# Patient Record
Sex: Female | Born: 1970 | Race: Black or African American | Hispanic: No | Marital: Married | Smoking: Never smoker
Health system: Southern US, Community
[De-identification: ages and names within clinical notes are randomized; demographics above are authoritative.]

## PROBLEM LIST (undated history)

## (undated) DIAGNOSIS — E119 Type 2 diabetes mellitus without complications: Secondary | ICD-10-CM

## (undated) DIAGNOSIS — I10 Essential (primary) hypertension: Secondary | ICD-10-CM

## (undated) DIAGNOSIS — E079 Disorder of thyroid, unspecified: Secondary | ICD-10-CM

## (undated) DIAGNOSIS — K219 Gastro-esophageal reflux disease without esophagitis: Secondary | ICD-10-CM

## (undated) HISTORY — DX: Type 2 diabetes mellitus without complications: E11.9

## (undated) HISTORY — DX: Essential (primary) hypertension: I10

## (undated) HISTORY — DX: Disorder of thyroid, unspecified: E07.9

---

## 1995-09-26 HISTORY — PX: TUBAL LIGATION: SHX77

## 1998-02-24 ENCOUNTER — Other Ambulatory Visit: Admission: RE | Admit: 1998-02-24 | Discharge: 1998-02-24 | Payer: Self-pay | Admitting: Obstetrics and Gynecology

## 1998-05-18 ENCOUNTER — Ambulatory Visit (HOSPITAL_COMMUNITY): Admission: RE | Admit: 1998-05-18 | Discharge: 1998-05-18 | Payer: Self-pay | Admitting: Obstetrics and Gynecology

## 2000-01-24 ENCOUNTER — Other Ambulatory Visit: Admission: RE | Admit: 2000-01-24 | Discharge: 2000-01-24 | Payer: Self-pay | Admitting: Obstetrics & Gynecology

## 2001-01-25 ENCOUNTER — Other Ambulatory Visit: Admission: RE | Admit: 2001-01-25 | Discharge: 2001-01-25 | Payer: Self-pay | Admitting: Obstetrics and Gynecology

## 2004-12-13 ENCOUNTER — Other Ambulatory Visit: Admission: RE | Admit: 2004-12-13 | Discharge: 2004-12-13 | Payer: Self-pay | Admitting: Obstetrics and Gynecology

## 2006-01-10 ENCOUNTER — Other Ambulatory Visit: Admission: RE | Admit: 2006-01-10 | Discharge: 2006-01-10 | Payer: Self-pay | Admitting: Obstetrics & Gynecology

## 2007-01-18 ENCOUNTER — Other Ambulatory Visit: Admission: RE | Admit: 2007-01-18 | Discharge: 2007-01-18 | Payer: Self-pay | Admitting: Obstetrics & Gynecology

## 2007-12-20 ENCOUNTER — Other Ambulatory Visit: Admission: RE | Admit: 2007-12-20 | Discharge: 2007-12-20 | Payer: Self-pay | Admitting: Obstetrics and Gynecology

## 2008-12-28 ENCOUNTER — Other Ambulatory Visit: Admission: RE | Admit: 2008-12-28 | Discharge: 2008-12-28 | Payer: Self-pay | Admitting: Obstetrics and Gynecology

## 2009-12-29 ENCOUNTER — Other Ambulatory Visit: Admission: RE | Admit: 2009-12-29 | Discharge: 2009-12-29 | Payer: Self-pay | Admitting: Obstetrics and Gynecology

## 2011-05-15 ENCOUNTER — Other Ambulatory Visit (HOSPITAL_COMMUNITY)
Admission: RE | Admit: 2011-05-15 | Discharge: 2011-05-15 | Disposition: A | Discharge status: Home / Self Care | Payer: PRIVATE HEALTH INSURANCE | Source: Ambulatory Visit | Attending: Obstetrics and Gynecology | Admitting: Obstetrics and Gynecology

## 2011-05-15 ENCOUNTER — Other Ambulatory Visit: Payer: Self-pay | Admitting: Obstetrics and Gynecology

## 2011-05-15 DIAGNOSIS — Z01419 Encounter for gynecological examination (general) (routine) without abnormal findings: Secondary | ICD-10-CM | POA: Insufficient documentation

## 2012-05-17 ENCOUNTER — Other Ambulatory Visit (HOSPITAL_COMMUNITY)
Admission: RE | Admit: 2012-05-17 | Discharge: 2012-05-17 | Disposition: A | Discharge status: Home / Self Care | Payer: BC Managed Care – PPO | Source: Ambulatory Visit | Attending: Obstetrics and Gynecology | Admitting: Obstetrics and Gynecology

## 2012-05-17 DIAGNOSIS — Z01419 Encounter for gynecological examination (general) (routine) without abnormal findings: Secondary | ICD-10-CM | POA: Insufficient documentation

## 2012-05-17 DIAGNOSIS — Z1151 Encounter for screening for human papillomavirus (HPV): Secondary | ICD-10-CM | POA: Insufficient documentation

## 2012-05-24 ENCOUNTER — Ambulatory Visit
Admission: RE | Admit: 2012-05-24 | Discharge: 2012-05-24 | Disposition: A | Discharge status: Home / Self Care | Payer: BC Managed Care – PPO | Source: Ambulatory Visit | Attending: Family Medicine | Admitting: Family Medicine

## 2012-05-24 ENCOUNTER — Other Ambulatory Visit: Payer: Self-pay | Admitting: Family Medicine

## 2012-05-24 DIAGNOSIS — E041 Nontoxic single thyroid nodule: Secondary | ICD-10-CM

## 2012-05-28 ENCOUNTER — Other Ambulatory Visit: Payer: Self-pay | Admitting: Family Medicine

## 2012-05-28 DIAGNOSIS — E042 Nontoxic multinodular goiter: Secondary | ICD-10-CM

## 2012-05-29 ENCOUNTER — Ambulatory Visit
Admission: RE | Admit: 2012-05-29 | Discharge: 2012-05-29 | Disposition: A | Discharge status: Home / Self Care | Payer: BC Managed Care – PPO | Source: Ambulatory Visit | Attending: Family Medicine | Admitting: Family Medicine

## 2012-05-29 ENCOUNTER — Other Ambulatory Visit (HOSPITAL_COMMUNITY)
Admission: RE | Admit: 2012-05-29 | Discharge: 2012-05-29 | Disposition: A | Discharge status: Home / Self Care | Payer: BC Managed Care – PPO | Source: Ambulatory Visit | Attending: Interventional Radiology | Admitting: Interventional Radiology

## 2012-05-29 DIAGNOSIS — E042 Nontoxic multinodular goiter: Secondary | ICD-10-CM

## 2012-05-29 DIAGNOSIS — E049 Nontoxic goiter, unspecified: Secondary | ICD-10-CM | POA: Insufficient documentation

## 2013-11-29 ENCOUNTER — Encounter: Payer: 59 | Attending: Family Medicine

## 2013-11-29 VITALS — Ht 63.0 in | Wt 248.4 lb

## 2013-11-29 DIAGNOSIS — Z713 Dietary counseling and surveillance: Secondary | ICD-10-CM | POA: Insufficient documentation

## 2013-11-29 DIAGNOSIS — E119 Type 2 diabetes mellitus without complications: Secondary | ICD-10-CM | POA: Insufficient documentation

## 2013-11-29 NOTE — Progress Notes (Signed)
Patient was seen on 11/29/13 for the complete diabetes self-management series at the [facility].  Current A1c = unknown  Handouts given during class include:  Living Well with Diabetes book  Carb Counting and Meal Planning book  Meal Plan Card  Carbohydrate guide  Meal planning worksheet  Low Sodium Flavoring Tips  The diabetes portion plate  Low Carbohydrate Snack Suggestions  A1c to eAG Conversion Chart  Diabetes Medications  Stress Management  Diabetes Recommended Care Schedule  Diabetes Success Plan  Core Class Satisfaction Survey  The following learning objectives were met by the patient during this course:  Describe diabetes  State some common risk factors for diabetes  Defines the role of glucose and insulin  Identifies type of diabetes and pathophysiology  Describe the relationship between diabetes and cardiovascular risk  State the members of the Healthcare Team  States the rationale for glucose monitoring  State when to test glucose  State their individual Target Range  State the importance of logging glucose readings  Describe how to interpret glucose readings  Identifies A1C target  Explain the correlation between A1c and eAG values  State symptoms and treatment of high blood glucose  State symptoms and treatment of low blood glucose  Explain proper technique for glucose testing  Identifies proper sharps disposal  Describe the role of different macronutrients on glucose  Explain how carbohydrates affect blood glucose  State what foods contain the most carbohydrates  Demonstrate carbohydrate counting  Demonstrate how to read Nutrition Facts food label  Describe effects of various fats on heart health  Describe the importance of good nutrition for health and healthy eating strategies  Describe techniques for managing your shopping, cooking and meal planning  List strategies to follow meal plan  when dining out  Describe the effects of alcohol on glucose and how to use it safely   State the amount of activity recommended for healthy living   Describe activities suitable for individual needs   Identify ways to regularly incorporate activity into daily life   Identify barriers to activity and ways to over come these barriers  Identify diabetes medications being personally used and their primary action for lowering glucose and possible side effects   Describe role of stress on blood glucose and develop strategies to address psychosocial issues   Identify diabetes complications and ways to prevent them  Explain how to manage diabetes during illness   Evaluate success in meeting personal goal   Establish 2-3 goals that they will plan to diligently work on until they return for the  23-monthfollow-up visit  Goals:  Follow Diabetes Meal Plan as instructed  Eat 3 meals and 2 snacks, every 3-5 hrs  Limit carbohydrate intake to 45 grams carbohydrate/meal Limit carbohydrate intake to 15 grams carbohydrate/snack Add lean protein foods to meals/snacks  Monitor glucose levels as instructed by your doctor  Aim for 15-30 mins of physical activity daily as tolerated  Bring food record and glucose log to your next nutrition visit  Your patient has established the following 4 month goals in their individualized success plan: I will count my carb choices at most meals and snacks and stop drinking regular sodas and juices I will increase my activity level 30 minutes a day  7 days a week  Your patient has identified these potential barriers to change:  No, I have to make better choices, especially when eating out  Your patient has identified their diabetes self-care support plan as  My family and co-workers   

## 2013-12-07 ENCOUNTER — Emergency Department (HOSPITAL_COMMUNITY)
Admission: EM | Admit: 2013-12-07 | Discharge: 2013-12-07 | Disposition: A | Discharge status: Home / Self Care | Payer: Self-pay

## 2013-12-08 ENCOUNTER — Other Ambulatory Visit: Payer: Self-pay | Admitting: Obstetrics and Gynecology

## 2013-12-08 DIAGNOSIS — Z975 Presence of (intrauterine) contraceptive device: Secondary | ICD-10-CM

## 2013-12-12 ENCOUNTER — Ambulatory Visit (HOSPITAL_COMMUNITY)
Admission: RE | Admit: 2013-12-12 | Discharge: 2013-12-12 | Disposition: A | Discharge status: Home / Self Care | Payer: 59 | Source: Ambulatory Visit | Attending: Obstetrics and Gynecology | Admitting: Obstetrics and Gynecology

## 2013-12-12 DIAGNOSIS — Z975 Presence of (intrauterine) contraceptive device: Secondary | ICD-10-CM

## 2013-12-12 DIAGNOSIS — Z30431 Encounter for routine checking of intrauterine contraceptive device: Secondary | ICD-10-CM | POA: Insufficient documentation

## 2013-12-17 ENCOUNTER — Other Ambulatory Visit: Payer: Self-pay | Admitting: Obstetrics and Gynecology

## 2013-12-17 ENCOUNTER — Ambulatory Visit (HOSPITAL_COMMUNITY): Payer: 59

## 2013-12-17 DIAGNOSIS — N83202 Unspecified ovarian cyst, left side: Secondary | ICD-10-CM

## 2014-01-27 ENCOUNTER — Ambulatory Visit (HOSPITAL_COMMUNITY)
Admission: RE | Admit: 2014-01-27 | Discharge: 2014-01-27 | Disposition: A | Discharge status: Home / Self Care | Payer: 59 | Source: Ambulatory Visit | Attending: Obstetrics and Gynecology | Admitting: Obstetrics and Gynecology

## 2014-01-27 DIAGNOSIS — N83202 Unspecified ovarian cyst, left side: Secondary | ICD-10-CM

## 2014-01-27 DIAGNOSIS — N83209 Unspecified ovarian cyst, unspecified side: Secondary | ICD-10-CM | POA: Insufficient documentation

## 2014-03-20 ENCOUNTER — Other Ambulatory Visit: Payer: Self-pay | Admitting: Endocrinology

## 2014-03-30 ENCOUNTER — Encounter: Payer: 59 | Attending: Family Medicine

## 2014-03-30 DIAGNOSIS — Z713 Dietary counseling and surveillance: Secondary | ICD-10-CM | POA: Insufficient documentation

## 2014-03-30 DIAGNOSIS — E119 Type 2 diabetes mellitus without complications: Secondary | ICD-10-CM | POA: Diagnosis present

## 2014-03-31 NOTE — Progress Notes (Signed)
Patient was seen on 03/30/2014 for a review of the series of three diabetes self-management courses at the [facility]. The following learning objectives were met by the patient during this class:    Reviewed blood glucose monitoring and interpretation including the recommended target ranges and Hgb A1c.    Reviewed on carb counting, importance of regularly scheduled meals/snacks, and meal planning.    Reviewed the effects of physical activity on glucose levels and long-term glucose control.  Recommended goal of 150 minutes of physical activity/week.   Reviewed patient medications and discussed role of medication on blood glucose and possible side effects.   Discussed strategies to manage stress, psychosocial issues, and other obstacles to diabetes management.   Encouraged moderate weight reduction to improve glucose levels.     Reviewed short-term complications: hyper- and hypo-glycemia.  Discussed causes, symptoms, and treatment options.   Reviewed prevention, detection, and treatment of long-term complications.  Discussed the role of prolonged elevated glucose levels on body systems.  Goals:  Follow Diabetes Meal Plan as instructed  Eat 3 meals and 2 snacks, every 3-5 hrs  Limit carbohydrate intake to 45 grams carbohydrate/meal Limit carbohydrate intake to 15 grams carbohydrate/snack Add lean protein foods to meals/snacks  Monitor glucose levels as instructed by your doctor  Aim for goal of 15-30 mins of physical activity daily as tolerated  Bring food record and glucose log to your next nutrition visit  

## 2014-04-16 ENCOUNTER — Encounter: Payer: Self-pay | Admitting: Endocrinology

## 2014-04-16 ENCOUNTER — Ambulatory Visit (INDEPENDENT_AMBULATORY_CARE_PROVIDER_SITE_OTHER): Payer: 59 | Admitting: Endocrinology

## 2014-04-16 VITALS — BP 138/98 | HR 90 | Resp 16 | Wt 248.4 lb

## 2014-04-16 DIAGNOSIS — E041 Nontoxic single thyroid nodule: Secondary | ICD-10-CM

## 2014-04-16 NOTE — Progress Notes (Signed)
Patient ID: Kirsten [name], female   DOB: 08-Jan-1971, 43 y.o.   MRN: [id number]    Reason for Appointment: Thyroid nodule, followup    History of Present Illness:   The patient's thyroid nodule was first discovered in 8/13  She has had an ultrasound exam in 04/2012 which showed the following: Large, macrolobulated, smoothly made marginated cystic and solid mass occupying the majority of the left lobe of the thyroid gland. This is predominately solid and has internal blood flow with color Doppler. This measures approximately 9.5 x 3.9 x 3.1 cm in maximum dimensions.  Also has 2.7 x 2.0 x 1.3 cm oval, predominately solid mass with small cystic components in the mid and inferior portion of the right lobe of the  thyroid gland. This has internal blood flow with color Doppler.  Thyroid biopsy in 8/13 showed benign cytology  Because of the large size of the left thyroid nodule she was tried on levothyroxine for suppression. Initially with 75 mcg her TSH was relatively low and she was subsequently switched to 50 mcg. She has been taking this for about a year and is now here for followup. Does not feel any swelling in the neck herself  She has had no difficulty with swallowing  Does not feel like she has any choking sensation in her neck or pressure in any position or when lying down.    Medication List       This list is accurate as of: 04/16/14 11:59 PM.  Always use your most recent med list.               levothyroxine 50 MCG tablet  Commonly known as:  SYNTHROID, LEVOTHROID  TAKE 1 TABLET BY MOUTH EVERY MORNING ON EMPTY STOMACH     losartan 50 MG tablet  Commonly known as:  COZAAR  Take 50 mg by mouth daily.     metFORMIN 500 MG 24 hr tablet  Commonly known as:  GLUCOPHAGE-XR  Take 500 mg by mouth 4 (four) times daily.     MIRENA 20 MCG/24HR IUD  Generic drug:  levonorgestrel  1 each by Intrauterine route once.     Vitamin D3 5000 UNITS Tabs  Take by mouth.         Allergies: No Known Allergies  Past Medical History  Diagnosis Date  . Diabetes mellitus without complication   . Hypertension   . Thyroid disease     No past surgical history on file.  No family history on file.  Social History:  reports that she has never smoked. She has never used smokeless tobacco. Her alcohol and drug histories are not on file.   Review of Systems:  There is a  history of high blood pressure.             No history of Diabetes.             Examination:   BP 138/98  Pulse 90  Resp 16  Wt 248 lb 6.4 oz (112.674 kg)  SpO2 97%   General Appearance:  well-looking             THYROID: Thyroid nodule on the left side measures about 4 cm across and is smooth, firm, better felt on swallowing . Right lobe is felt about twice normal The neck circumference is 42.5 cm  There is no lymphadenopathy in the neck.    Reflexes  at biceps are normal.   Assessment/Plan:  Multinodular goiter with predominantly left thyroid  nodule which is benign on previous biopsy Previous records are not available and will be needed to compare her exam findings Will check her thyroid levels and assess need for any change in her levothyroxine dose  She will continue followup annually   [facility] 04/23/2014  Addendum: TSH is low, will stop her thyroid supplement and have her come back in 6 months  Lab Results  Component Value Date   TSH 0.16* 04/16/2014

## 2014-04-17 LAB — TSH: TSH: 0.16 u[IU]/mL — AB (ref 0.35–4.50)

## 2014-04-17 LAB — T4, FREE: Free T4: 0.92 ng/dL (ref 0.60–1.60)

## 2014-04-17 NOTE — Progress Notes (Signed)
Quick Note:  Please let patient know that the Thyroid result is high, stop Synthroid ______

## 2014-04-20 ENCOUNTER — Telehealth: Payer: Self-pay | Admitting: Endocrinology

## 2014-04-20 NOTE — Telephone Encounter (Signed)
Patient states she is returning Kirsten [name]'s call   Thank you

## 2014-04-22 ENCOUNTER — Telehealth: Payer: Self-pay | Admitting: *Deleted

## 2014-04-22 NOTE — Telephone Encounter (Signed)
She will not need to be on medication again. The test was being monitored since she was taking the thyroid pill We can see her again in 6 months for office visit with labs

## 2014-04-22 NOTE — Telephone Encounter (Signed)
Patient received my message about stopping her synthroid, she was told to follow up in one year. Patient wants to know how long she needs to stop it for and if you are going to recheck her labs before that one year is up?

## 2014-04-22 NOTE — Telephone Encounter (Signed)
CB# [phone]

## 2014-04-22 NOTE — Telephone Encounter (Signed)
Noted, patient is aware. 

## 2014-04-23 DIAGNOSIS — E041 Nontoxic single thyroid nodule: Secondary | ICD-10-CM | POA: Insufficient documentation

## 2014-05-18 ENCOUNTER — Other Ambulatory Visit: Payer: Self-pay | Admitting: Obstetrics and Gynecology

## 2014-05-18 ENCOUNTER — Other Ambulatory Visit (HOSPITAL_COMMUNITY)
Admission: RE | Admit: 2014-05-18 | Discharge: 2014-05-18 | Disposition: A | Discharge status: Home / Self Care | Payer: 59 | Source: Ambulatory Visit | Attending: Obstetrics and Gynecology | Admitting: Obstetrics and Gynecology

## 2014-05-18 DIAGNOSIS — Z124 Encounter for screening for malignant neoplasm of cervix: Secondary | ICD-10-CM | POA: Diagnosis not present

## 2014-05-18 DIAGNOSIS — Z1151 Encounter for screening for human papillomavirus (HPV): Secondary | ICD-10-CM | POA: Diagnosis present

## 2014-05-19 LAB — CYTOLOGY - PAP

## 2014-10-12 ENCOUNTER — Other Ambulatory Visit (INDEPENDENT_AMBULATORY_CARE_PROVIDER_SITE_OTHER): Payer: BLUE CROSS/BLUE SHIELD

## 2014-10-12 DIAGNOSIS — E041 Nontoxic single thyroid nodule: Secondary | ICD-10-CM

## 2014-10-12 LAB — T4, FREE: FREE T4: 0.75 ng/dL (ref 0.60–1.60)

## 2014-10-12 LAB — TSH: TSH: 1 u[IU]/mL (ref 0.35–4.50)

## 2014-10-16 ENCOUNTER — Ambulatory Visit: Payer: 59 | Admitting: Endocrinology

## 2014-10-20 ENCOUNTER — Encounter: Payer: Self-pay | Admitting: *Deleted

## 2014-10-20 ENCOUNTER — Telehealth: Payer: Self-pay | Admitting: Endocrinology

## 2014-10-20 ENCOUNTER — Ambulatory Visit: Payer: BLUE CROSS/BLUE SHIELD | Admitting: Endocrinology

## 2014-10-20 NOTE — Telephone Encounter (Signed)
Letter mailed

## 2014-10-20 NOTE — Telephone Encounter (Signed)
Patient no showed today's appt. Please advise on how to follow up. °A. No follow up necessary. °B. Follow up urgent. Contact patient immediately. °C. Follow up necessary. Contact patient and schedule visit in ___ days. °D. Follow up advised. Contact patient and schedule visit in ____weeks. ° °

## 2015-01-22 NOTE — Patient Outreach (Signed)
[facility] Care Management  01/22/2015  Carolann LittlerDana M Waibel 07/03/1971 [id number]  Member has not responded to appointment request letter sent on 12/11/14.  Member has not been seen by [facility] since 05/18/14.  Case closed as member has followed attendance policy.  Termination letter sent to member. Dudley MajorMelissa Mirabelle Cyphers RN, [facility] Care Management Coordinator-[facility] [phone]

## 2015-03-22 ENCOUNTER — Other Ambulatory Visit: Payer: Self-pay

## 2015-10-22 ENCOUNTER — Encounter: Payer: Self-pay | Admitting: Internal Medicine

## 2015-10-22 ENCOUNTER — Ambulatory Visit (INDEPENDENT_AMBULATORY_CARE_PROVIDER_SITE_OTHER): Payer: BLUE CROSS/BLUE SHIELD | Admitting: Internal Medicine

## 2015-10-22 VITALS — BP 136/86 | HR 96 | Ht 63.0 in | Wt 256.6 lb

## 2015-10-22 DIAGNOSIS — R05 Cough: Secondary | ICD-10-CM

## 2015-10-22 DIAGNOSIS — R058 Other specified cough: Secondary | ICD-10-CM

## 2015-10-22 MED ORDER — FAMOTIDINE 20 MG PO TABS: ORAL_TABLET | ORAL | Status: DC

## 2015-10-22 MED ORDER — TRAMADOL HCL 50 MG PO TABS: ORAL_TABLET | ORAL | Status: DC

## 2015-10-22 MED ORDER — PREDNISONE 10 MG PO TABS: ORAL_TABLET | ORAL | Status: DC

## 2015-10-22 MED ORDER — ESOMEPRAZOLE MAGNESIUM 40 MG PO CPDR: 40.0000 mg | DELAYED_RELEASE_CAPSULE | Freq: Every day | ORAL | Status: DC

## 2015-10-22 NOTE — Progress Notes (Signed)
Subjective:    Patient ID: Kirsten [name], female    DOB: April 18, 1971,     MRN: [id number]  HPI  55 yobf never smoker with bad cough dec 2015 x 3 m  and again oct 2016 x 6 weeks then early Dec 2016 recurrent /persistent so referred to pulmonary clinic 10/22/2015 by Dr [name].   10/22/2015 1st [facility] office visit/ Kirsten [name]   Chief Complaint  Patient presents with  . Pulmonary Consult    Referred by Dr. [name]. Pt c/o cough x 6 wks- prod at times with yellow sputum. Cough is worse at night and has coughed to the point of vomiting.   pattern is recurrent 1.5 years, abrupt in onset/ this time assoc with rhinorrhea which was clear and running forwards and backwards and so severe vomit.  rx already with augmentin Jan 10-20 and tessilon pearls not helping  - cough is 24/7 now unless takes tussionex which helps some Started nexium x one week and has h/o gerd on prn ppi x years but doesn't feel that bad so only rarely takes  Wt 180 during pregnancies most recent 20 years prior to OV  And now 260 with IUD with merena  No obvious patterns in day to day or daytime variabilty or assoc sob or cp or chest tightness, subjective wheeze overt sinus or hb symptoms. No unusual exp hx or h/o childhood pna/ asthma or knowledge of premature birth.  Sleeping ok without nocturnal  or early am exacerbation  of respiratory  c/o's or need for noct saba. Also denies any obvious fluctuation of symptoms with weather or environmental changes or other aggravating or alleviating factors except as outlined above   Current Medications, Allergies, Complete Past Medical History, Past Surgical History, Family History, and Social History were reviewed in [employer] record.            Review of Systems  Constitutional: Negative for fever, chills and unexpected weight change.  HENT: Positive for congestion and sneezing. Negative for dental problem, ear pain, nosebleeds, postnasal drip,  rhinorrhea, sinus pressure, sore throat, trouble swallowing and voice change.   Eyes: Negative for visual disturbance.  Respiratory: Positive for cough. Negative for choking and shortness of breath.   Cardiovascular: Negative for chest pain and leg swelling.  Gastrointestinal: Positive for abdominal pain. Negative for vomiting and diarrhea.  Genitourinary: Negative for difficulty urinating.  Musculoskeletal: Negative for arthralgias.  Skin: Negative for rash.  Neurological: Negative for tremors, syncope and headaches.  Hematological: Does not bruise/bleed easily.       Objective:   Physical Exam  amb obese bf nad  Wt Readings from Last 3 Encounters:  10/22/15 256 lb 9.6 oz (116.393 kg)  04/16/14 248 lb 6.4 oz (112.674 kg)  11/29/13 248 lb 6.4 oz (112.674 kg)    Vital signs reviewed   HEENT: nl turbinates, and oropharynx. Nl external ear canals without cough reflex/  Poor dentition    NECK :  without JVD/Nodes/TM/ nl carotid upstrokes bilaterally   LUNGS: no acc muscle use,  Nl contour chest which is clear to A and P bilaterally without cough on insp or exp maneuvers   CV:  RRR  no s3 or murmur or increase in P2, no edema   ABD:  soft and nontender with nl inspiratory excursion in the supine position. No bruits or organomegaly, bowel sounds nl  MS:  Nl gait/ ext warm without deformities, calf tenderness, cyanosis or clubbing No obvious joint  restrictions   SKIN: warm and dry without lesions    NEURO:  alert, approp, nl sensorium with  no motor deficits        Assessment & Plan:

## 2015-10-22 NOTE — Patient Instructions (Signed)
The key to effective treatment for your cough is eliminating the non-stop cycle of cough you're stuck in long enough to let your airway heal completely and then see if there is anything still making you cough once you stop the cough suppression, but this should take no more than 5 days to figure out  First take delsym two tsp every 12 hours and supplement if needed with  tramadol 50 mg up to 2 every 4 hours to suppress the urge to cough at all or even clear your throat. Swallowing water or using ice chips/non mint and menthol containing candies (such as lifesavers or sugarless jolly ranchers) are also effective.  You should rest your voice and avoid activities that you know make you cough.  Once you have eliminated the cough for 3 straight days try reducing the tramadol first,  then the delsym as tolerated.    Prednisone 10 mg take  4 each am x 2 days,   2 each am x 2 days,  1 each am x 2 days and stop (this is to eliminate allergies and inflammation from coughing)  Nexium 40 mg Take 30-60 min before first meal of the day and Pepcid 20 mg one bedtime plus chlorpheniramine 4 mg x 2 at bedtime (both available over the counter)  until cough is completely gone for at least a week without the need for cough suppression  For drainage / throat tickle try take CHLORPHENIRAMINE  4 mg - take one every 4 hours as needed - available over the counter- may cause drowsiness so start with just a bedtime dose or two and see how you tolerate it before trying in daytime    GERD (REFLUX)  is an extremely common cause of respiratory symptoms, many times with no significant heartburn at all.    It can be treated with medication, but also with lifestyle changes including avoidance of late meals, excessive alcohol, smoking cessation, and avoid fatty foods, chocolate, peppermint, colas, red wine, and acidic juices such as orange juice.  NO MINT OR MENTHOL PRODUCTS SO NO COUGH DROPS  USE HARD CANDY INSTEAD (jolley ranchers or  Stover's or Lifesavers (all available in sugarless versions) NO OIL BASED VITAMINS - use powdered substitutes.  If not better return for cxr early next week.

## 2015-10-23 DIAGNOSIS — R05 Cough: Secondary | ICD-10-CM | POA: Insufficient documentation

## 2015-10-23 DIAGNOSIS — R058 Other specified cough: Secondary | ICD-10-CM | POA: Insufficient documentation

## 2015-10-23 NOTE — Assessment & Plan Note (Addendum)
Body mass index is 45.47 .  Lab Results  Component Value Date   TSH 1.00 10/12/2014     Contributing to gerd tendency/ doe/reviewed the need and the process to achieve and maintain neg calorie balance > defer f/u primary care including intermittently monitoring thyroid status

## 2015-10-23 NOTE — Assessment & Plan Note (Addendum)
Most likely this is uacs related to gerd:  Of the three most common causes of chronic cough, only one (GERD)  can actually cause the other two (asthma and post nasal drip syndrome)  and perpetuate the cylce of cough inducing airway trauma, inflammation, heightened sensitivity to reflux which is prompted by the cough itself via a cyclical mechanism.    This may partially respond to steroids and look like asthma and post nasal drainage but never erradicated completely unless the cough and the secondary reflux are eliminated, preferably both at the same time.  While not intuitively obvious, many patients with chronic low grade reflux do not cough until there is a secondary insult that disturbs the protective epithelial barrier and exposes sensitive nerve endings.  This can be viral or direct physical injury such as with an endotracheal tube.   The point is that once this occurs, it is difficult to eliminate using anything but a maximally effective acid suppression regimen at least in the short run, accompanied by an appropriate diet to address non acid GERD.   DPI's typically make this cough worse and HRT tends to relax the LES making pts who are obese more prone to this cycle.  Will try max rx for gerd/ elimination of cyclical cough and f/u with cxr first of the week if not better and stop advair at this point.   Each maintenance medication was reviewed in detail including most importantly the difference between maintenance and as needed and under what circumstances the prns are to be used.  Please see instructions for details which were reviewed in writing and the patient given a copy.      Total time devoted to counseling  = 35/89m review case with pt/ discussion of options/alternatives/ giving and personally writing then going over instructions (see avs)

## 2016-01-14 ENCOUNTER — Other Ambulatory Visit: Payer: Self-pay | Admitting: Obstetrics and Gynecology

## 2016-01-14 ENCOUNTER — Other Ambulatory Visit (HOSPITAL_COMMUNITY)
Admission: RE | Admit: 2016-01-14 | Discharge: 2016-01-14 | Disposition: A | Discharge status: Home / Self Care | Payer: BLUE CROSS/BLUE SHIELD | Source: Ambulatory Visit | Attending: Obstetrics and Gynecology | Admitting: Obstetrics and Gynecology

## 2016-01-14 DIAGNOSIS — Z01419 Encounter for gynecological examination (general) (routine) without abnormal findings: Secondary | ICD-10-CM | POA: Insufficient documentation

## 2016-01-14 DIAGNOSIS — N760 Acute vaginitis: Secondary | ICD-10-CM | POA: Diagnosis present

## 2016-01-17 LAB — CYTOLOGY - PAP

## 2016-03-02 ENCOUNTER — Telehealth: Payer: Self-pay | Admitting: Internal Medicine

## 2016-03-02 NOTE — Telephone Encounter (Signed)
lmtcb X1 for pt.  Pt was given GERD diet at her last OV.

## 2016-03-03 NOTE — Telephone Encounter (Signed)
Pt returning call from yesterday °

## 2016-03-03 NOTE — Telephone Encounter (Signed)
Spoke with pt. She is aware of MW's GERD diet. A copy of her AVS has been placed in the outgoing mail. Nothing further was needed.

## 2017-02-16 ENCOUNTER — Telehealth: Payer: Self-pay | Admitting: Endocrinology

## 2017-02-16 NOTE — Telephone Encounter (Signed)
Patient called to inquire on the specific dates she had "noshows" and cancellations since she was advised that she no longer is able to come to Endocrinology. I advised patient that there was a cancellation 10-16-14 and a no show 10-20-14. I advised that her last appointment was 04-16-14. Patient disconnected.

## 2017-03-22 ENCOUNTER — Other Ambulatory Visit: Payer: Self-pay | Admitting: Endocrinology

## 2017-03-22 DIAGNOSIS — E041 Nontoxic single thyroid nodule: Secondary | ICD-10-CM

## 2017-04-02 ENCOUNTER — Other Ambulatory Visit: Payer: Self-pay | Admitting: Endocrinology

## 2017-04-02 ENCOUNTER — Ambulatory Visit
Admission: RE | Admit: 2017-04-02 | Discharge: 2017-04-02 | Disposition: A | Discharge status: Home / Self Care | Payer: Self-pay | Source: Ambulatory Visit | Attending: Endocrinology | Admitting: Endocrinology

## 2017-04-02 DIAGNOSIS — E041 Nontoxic single thyroid nodule: Secondary | ICD-10-CM

## 2017-04-05 ENCOUNTER — Other Ambulatory Visit (HOSPITAL_COMMUNITY)
Admission: RE | Admit: 2017-04-05 | Discharge: 2017-04-05 | Disposition: A | Discharge status: Home / Self Care | Payer: No Typology Code available for payment source | Source: Ambulatory Visit | Attending: Radiology | Admitting: Radiology

## 2017-04-05 ENCOUNTER — Ambulatory Visit
Admission: RE | Admit: 2017-04-05 | Discharge: 2017-04-05 | Disposition: A | Discharge status: Home / Self Care | Payer: No Typology Code available for payment source | Source: Ambulatory Visit | Attending: Endocrinology | Admitting: Endocrinology

## 2017-04-05 DIAGNOSIS — E041 Nontoxic single thyroid nodule: Secondary | ICD-10-CM

## 2017-05-08 ENCOUNTER — Encounter (HOSPITAL_COMMUNITY): Payer: Self-pay

## 2017-05-08 ENCOUNTER — Other Ambulatory Visit: Payer: Self-pay

## 2017-05-08 ENCOUNTER — Encounter (HOSPITAL_COMMUNITY)
Admission: RE | Admit: 2017-05-08 | Discharge: 2017-05-08 | Disposition: A | Discharge status: Home / Self Care | Payer: No Typology Code available for payment source | Source: Ambulatory Visit | Attending: Obstetrics and Gynecology | Admitting: Obstetrics and Gynecology

## 2017-05-08 DIAGNOSIS — I444 Left anterior fascicular block: Secondary | ICD-10-CM | POA: Insufficient documentation

## 2017-05-08 DIAGNOSIS — Z01810 Encounter for preprocedural cardiovascular examination: Secondary | ICD-10-CM | POA: Diagnosis present

## 2017-05-08 DIAGNOSIS — Z01812 Encounter for preprocedural laboratory examination: Secondary | ICD-10-CM | POA: Insufficient documentation

## 2017-05-08 HISTORY — DX: Gastro-esophageal reflux disease without esophagitis: K21.9

## 2017-05-08 LAB — BASIC METABOLIC PANEL
Anion gap: 9 (ref 5–15)
BUN: 8 mg/dL (ref 6–20)
CHLORIDE: 102 mmol/L (ref 101–111)
CO2: 26 mmol/L (ref 22–32)
Calcium: 9 mg/dL (ref 8.9–10.3)
Creatinine, Ser: 0.8 mg/dL (ref 0.44–1.00)
GFR calc Af Amer: >60 mL/min (ref >60)
GFR calc non Af Amer: >60 mL/min (ref >60)
Glucose, Bld: 294 mg/dL — ABNORMAL HIGH (ref 65–99)
POTASSIUM: 3.9 mmol/L (ref 3.5–5.1)
SODIUM: 137 mmol/L (ref 135–145)

## 2017-05-08 LAB — CBC
HEMATOCRIT: 36.2 % (ref 36.0–46.0)
HEMOGLOBIN: 12.3 g/dL (ref 12.0–15.0)
MCH: 26 pg (ref 26.0–34.0)
MCHC: 34 g/dL (ref 30.0–36.0)
MCV: 76.5 fL — ABNORMAL LOW (ref 78.0–100.0)
Platelets: 262 10*3/uL (ref 150–400)
RBC: 4.73 MIL/uL (ref 3.87–5.11)
RDW: 14.2 % (ref 11.5–15.5)
WBC: 8.4 10*3/uL (ref 4.0–10.5)

## 2017-05-08 NOTE — Patient Instructions (Signed)
Your procedure is scheduled on:  Wednesday, Aug. 22, 2018  [facility]: 11:15 AM  Pick up the phone at the desk and dial [phone].  Call this number if you have problems the morning of surgery: [phone].  Remember: Do NOT eat food:  After Midnight Tuesday  Do NOT drink clear liquids after:  6:30 AM day of surgery  Take these medicines the morning of surgery with a SIP OF WATER:  Atorvastatin, Levothyroxine, Losartan  Do NOT take Metformin the evening before surgery  Stop ALL herbal medications at this time  Do NOT smoke the day of surgery.  Do NOT wear jewelry (body piercing), metal hair clips/bobby pins, make-up, artifical eyelashes or nail polish. Do NOT wear lotions, powders, or perfumes.  You may wear deodorant. Do NOT shave for 48 hours prior to surgery. Do NOT bring valuables to the hospital. Contacts, dentures, or bridgework may not be worn into surgery.  Have a responsible adult drive you home and stay with you for 24 hours after your procedure  Bring a copy of your healthcare power of attorney and living will documents.

## 2017-05-08 NOTE — Pre-Procedure Instructions (Signed)
Dr. Tacy Duraddono made aware of elevated glucose, no new orders received at this time.  I spoke with Ms. Kirsten [name] to inform her of the elevated glucose she was not surprised she stated her last Hgb A 1C was between 9-10.  She is currently working on bringing her blood glucose levels down. I did make her aware that if her glucose was too high the day of surgery the case may get cancelled until her diabetes is under better control and she verbalized understanding.

## 2017-05-15 NOTE — H&P (Signed)
History of Present Illness  General:  Pt reports heavy bleeding for the first 5 days. Does not have cramps. Changing an overnight super pad every 2-3 hours. Clots are quarter sized or bigger. Pt is getting up 2-3 times overnight. Has a gushing. Has not seen any improvement with Lysteda. Does not want hormonal therapy. Pt wants an endometrial ablation. Last ultrasound 01/2016 showed a normal uterus with no massess, ovariaes normal. IUD correctly positioned at that time. 12/2016 pt requested IUD removal due to irregular bleeding. Pt had previously been treated with OCPs while IUD was in place to regulate bleeding.   Current Medications  Taking   Lysteda(Tranexamic Acid) 650 MG Tablet 2 tablets Orally TID up to 5 days with menses   Losartan Potassium 50 MG Tablet 1 tablet Orally Once a day   Atorvastatin Calcium 10 MG Tablet 1 tablet Orally Once a day   Synjardy(Empagliflozin-Metformin HCl) 12.01-999 MG Tablet 1 tablet with meals Orally Twice a day   Levothyroxine Sodium 25 MCG Tablet 1 tablet on an empty stomach in the morning Orally Once a day   Not-Taking   FreeStyle Lite Test(Blood Glucose Test) Strip check 2 times per day In Vitro   FreeStyle Lancets Miscellaneous check 2 times per day   Proctosol HC(Hydrocortisone) 2.5 % Cream 1 application to affected area Rectal Twice a day fro 5 days, Notes: prn   Valtrex(ValACYclovir HCl) 500 MG Tablet 1 tablet Orally every 12 hrs for 3 days prn per Dr. Dion Body   Diflucan(Fluconazole) 150 MG Tablet 1 tablet Orally once as needed for yeast   Medication List reviewed and reconciled with the patient    Past Medical History  Diabetes Mellitus, type II.   Obesity.   multinodular goiter, s/p FNA 9/13, dr Lucianne Muss.   Early cataracts.           Surgical History  BTL   thyroid bx 03/2017   Family History  Father: alive, DM, HTN, hypercholesterolemia, Prostate cancer, bladder cancer, diagnosed with Diabetes, Hypertension  Mother: alive, HTN,  hypercholesterolemia, seizure disorder, diagnosed with Hypertension  Paternal Grand Father: deceased  Paternal Grand Mother: deceased, diagnosed with Hypertension, Diabetes  Maternal Grand Father: deceased  Maternal Grand Mother: deceased  Brother 1: alive  Sister 1: alive, breast CA, 3, DM, HTN  Paternal uncle: lung cancer  Paternal aunt: 2 with DM, diagnosed with Diabetes  Maternal uncle: stomach CA, diagnosed with Lung Cancer  1 brother(s) , 1 sister(s) .   Cousins have had thyroid enlargement, some needing surgery.   Social History  General:  Tobacco use  cigarettes: Never smoked Tobacco history last updated 04/17/2017 no EXPOSURE TO PASSIVE SMOKE.  no Alcohol, Rare.  no Recreational drug use.  Exercise: minimal.  Marital Status: Divorced, married 10/12 Bobby.  Children: [place], [place], [place], 1 step child, 2 grands.  OCCUPATION: CMA at [employer].  Religion: [employer] [facility].  no Tobacco Exposure.    Gyn History  Sexual activity currently sexually active.  Periods : every 28 days.  LMP 03/27/17.  Birth control BTL.  Last pap smear date 12/2015, all negative.  Last mammogram date 06/19/11, Dx mammo, 6 month f/u, neg, 12/02/13, 12/07/15, 3D mammo, 12/07/16.  Denies H/O Abnormal pap smear.  STD HSV.    OB History  Number of pregnancies 5.  miscarriages 1.  abortion 1.  Pregnancy # 1 live birth, vaginal delivery.  Pregnancy # 2 live birth, vaginal delivery.  Pregnancy # 3 live birth, vaginal delivery.    Allergies  N.K.D.A.   Hospitalization/Major Diagnostic Procedure  childbirth 91, 92, 96   Vital Signs  Wt 245, Wt change -5.4 lb, Ht 63, BMI 43.40, Pulse sitting 84, BP sitting 118/77.   Physical Examination  GENERAL:  Patient appears alert and oriented.  General Appearance: well-appearing, well-developed, no acute distress.  Speech: clear.     Assessments   1. Menorrhagia with regular cycle - N92.0 (Primary)   Treatment  1. Menorrhagia with regular  cycle  Referral [id number] - Gynecology Reason:Precert for Hyst/D&C, HTA ablation    Follow Up  prn (Reason: preop)

## 2017-05-16 ENCOUNTER — Encounter (HOSPITAL_COMMUNITY)
Admission: RE | Disposition: A | Discharge status: Home / Self Care | Payer: Self-pay | Source: Ambulatory Visit | Attending: Obstetrics and Gynecology

## 2017-05-16 ENCOUNTER — Ambulatory Visit (HOSPITAL_COMMUNITY): Payer: No Typology Code available for payment source | Admitting: Anesthesiology

## 2017-05-16 ENCOUNTER — Encounter (HOSPITAL_COMMUNITY): Payer: Self-pay

## 2017-05-16 ENCOUNTER — Ambulatory Visit (HOSPITAL_COMMUNITY)
Admission: RE | Admit: 2017-05-16 | Discharge: 2017-05-16 | Disposition: A | Discharge status: Home / Self Care | Payer: No Typology Code available for payment source | Source: Ambulatory Visit | Attending: Obstetrics and Gynecology | Admitting: Obstetrics and Gynecology

## 2017-05-16 DIAGNOSIS — E669 Obesity, unspecified: Secondary | ICD-10-CM | POA: Diagnosis not present

## 2017-05-16 DIAGNOSIS — N840 Polyp of corpus uteri: Secondary | ICD-10-CM | POA: Diagnosis not present

## 2017-05-16 DIAGNOSIS — Z79899 Other long term (current) drug therapy: Secondary | ICD-10-CM | POA: Insufficient documentation

## 2017-05-16 DIAGNOSIS — N920 Excessive and frequent menstruation with regular cycle: Secondary | ICD-10-CM | POA: Insufficient documentation

## 2017-05-16 DIAGNOSIS — E119 Type 2 diabetes mellitus without complications: Secondary | ICD-10-CM | POA: Diagnosis not present

## 2017-05-16 HISTORY — PX: DILITATION & CURRETTAGE/HYSTROSCOPY WITH HYDROTHERMAL ABLATION: SHX5570

## 2017-05-16 LAB — GLUCOSE, CAPILLARY
GLUCOSE-CAPILLARY: 205 mg/dL — AB (ref 65–99)
Glucose-Capillary: 165 mg/dL — ABNORMAL HIGH (ref 65–99)

## 2017-05-16 LAB — TYPE AND SCREEN
ABO/RH(D): O POS
Antibody Screen: NEGATIVE

## 2017-05-16 LAB — PREGNANCY, URINE: PREG TEST UR: NEGATIVE

## 2017-05-16 LAB — ABO/RH: ABO/RH(D): O POS

## 2017-05-16 SURGERY — DILATATION & CURETTAGE/HYSTEROSCOPY WITH HYDROTHERMAL ABLATION
Anesthesia: General | Site: Vagina

## 2017-05-16 MED ORDER — PROMETHAZINE HCL 25 MG/ML IJ SOLN
INTRAMUSCULAR | Status: AC
  Filled 2017-05-16: qty 1

## 2017-05-16 MED ORDER — FENTANYL CITRATE (PF) 100 MCG/2ML IJ SOLN
INTRAMUSCULAR | Status: DC | PRN
  Administered 2017-05-16 (×2): 50 ug via INTRAVENOUS

## 2017-05-16 MED ORDER — DEXAMETHASONE SODIUM PHOSPHATE 4 MG/ML IJ SOLN
INTRAMUSCULAR | Status: AC
  Filled 2017-05-16: qty 1

## 2017-05-16 MED ORDER — FENTANYL CITRATE (PF) 100 MCG/2ML IJ SOLN
25.0000 ug | INTRAMUSCULAR | Status: DC | PRN
  Administered 2017-05-16 (×3): 50 ug via INTRAVENOUS

## 2017-05-16 MED ORDER — PHENYLEPHRINE HCL 10 MG/ML IJ SOLN
INTRAMUSCULAR | Status: DC | PRN
  Administered 2017-05-16 (×3): 80 ug via INTRAVENOUS

## 2017-05-16 MED ORDER — SCOPOLAMINE 1 MG/3DAYS TD PT72
1.0000 | MEDICATED_PATCH | Freq: Once | TRANSDERMAL | Status: DC
  Administered 2017-05-16: 1.5 mg via TRANSDERMAL

## 2017-05-16 MED ORDER — MEPERIDINE HCL 25 MG/ML IJ SOLN: 6.2500 mg | INTRAMUSCULAR | Status: DC | PRN

## 2017-05-16 MED ORDER — KETOROLAC TROMETHAMINE 30 MG/ML IJ SOLN
INTRAMUSCULAR | Status: DC | PRN
  Administered 2017-05-16: 30 mg via INTRAVENOUS

## 2017-05-16 MED ORDER — LIDOCAINE HCL 2 % IJ SOLN
INTRAMUSCULAR | Status: DC | PRN
  Administered 2017-05-16: 10 mL

## 2017-05-16 MED ORDER — METOCLOPRAMIDE HCL 5 MG/ML IJ SOLN
10.0000 mg | Freq: Once | INTRAMUSCULAR | Status: AC | PRN
  Administered 2017-05-16: 10 mg via INTRAVENOUS

## 2017-05-16 MED ORDER — PROMETHAZINE HCL 25 MG/ML IJ SOLN
6.2500 mg | Freq: Once | INTRAMUSCULAR | Status: AC
  Administered 2017-05-16: 6.25 mg via INTRAVENOUS

## 2017-05-16 MED ORDER — ONDANSETRON HCL 4 MG/2ML IJ SOLN
INTRAMUSCULAR | Status: AC
  Filled 2017-05-16: qty 2

## 2017-05-16 MED ORDER — SCOPOLAMINE 1 MG/3DAYS TD PT72
MEDICATED_PATCH | TRANSDERMAL | Status: AC
  Administered 2017-05-16: 1.5 mg via TRANSDERMAL
  Filled 2017-05-16: qty 1

## 2017-05-16 MED ORDER — METOCLOPRAMIDE HCL 5 MG/ML IJ SOLN
INTRAMUSCULAR | Status: AC
  Filled 2017-05-16: qty 2

## 2017-05-16 MED ORDER — ONDANSETRON HCL 4 MG/2ML IJ SOLN
INTRAMUSCULAR | Status: DC | PRN
  Administered 2017-05-16: 4 mg via INTRAVENOUS

## 2017-05-16 MED ORDER — MIDAZOLAM HCL 2 MG/2ML IJ SOLN
INTRAMUSCULAR | Status: DC | PRN
  Administered 2017-05-16: 2 mg via INTRAVENOUS

## 2017-05-16 MED ORDER — IBUPROFEN 800 MG PO TABS: 800.0000 mg | ORAL_TABLET | Freq: Four times a day (QID) | ORAL | 1 refills | Status: DC | PRN

## 2017-05-16 MED ORDER — SODIUM CHLORIDE 0.9 % IR SOLN
Status: DC | PRN
  Administered 2017-05-16: 3000 mL

## 2017-05-16 MED ORDER — HYDROCODONE-ACETAMINOPHEN 7.5-325 MG PO TABS
1.0000 | ORAL_TABLET | Freq: Once | ORAL | Status: AC | PRN
  Administered 2017-05-16: 1 via ORAL

## 2017-05-16 MED ORDER — PROPOFOL 10 MG/ML IV BOLUS
INTRAVENOUS | Status: AC
  Filled 2017-05-16: qty 20

## 2017-05-16 MED ORDER — FENTANYL CITRATE (PF) 100 MCG/2ML IJ SOLN
INTRAMUSCULAR | Status: AC
  Filled 2017-05-16: qty 2

## 2017-05-16 MED ORDER — PROPOFOL 10 MG/ML IV BOLUS
INTRAVENOUS | Status: DC | PRN
  Administered 2017-05-16: 200 mg via INTRAVENOUS

## 2017-05-16 MED ORDER — LIDOCAINE HCL 2 % IJ SOLN
INTRAMUSCULAR | Status: AC
  Filled 2017-05-16: qty 20

## 2017-05-16 MED ORDER — LIDOCAINE HCL (CARDIAC) 20 MG/ML IV SOLN
INTRAVENOUS | Status: DC | PRN
  Administered 2017-05-16: 50 mg via INTRAVENOUS

## 2017-05-16 MED ORDER — LACTATED RINGERS IV SOLN
INTRAVENOUS | Status: DC
  Administered 2017-05-16 (×2): via INTRAVENOUS

## 2017-05-16 MED ORDER — MIDAZOLAM HCL 2 MG/2ML IJ SOLN
INTRAMUSCULAR | Status: AC
  Filled 2017-05-16: qty 2

## 2017-05-16 MED ORDER — FENTANYL CITRATE (PF) 100 MCG/2ML IJ SOLN
INTRAMUSCULAR | Status: AC
  Administered 2017-05-16: 50 ug via INTRAVENOUS
  Filled 2017-05-16: qty 2

## 2017-05-16 MED ORDER — HYDROCODONE-ACETAMINOPHEN 7.5-325 MG PO TABS
ORAL_TABLET | ORAL | Status: AC
  Filled 2017-05-16: qty 1

## 2017-05-16 SURGICAL SUPPLY — 13 items
CANISTER SUCT 3000ML PPV (MISCELLANEOUS) ×3 IMPLANT
CATH ROBINSON RED A/P 16FR (CATHETERS) ×3 IMPLANT
CLOTH BEACON ORANGE TIMEOUT ST (SAFETY) ×3 IMPLANT
CONTAINER PREFILL 10% NBF 60ML (FORM) ×6 IMPLANT
DILATOR CANAL MILEX (MISCELLANEOUS) IMPLANT
GLOVE BIO SURGEON STRL SZ7 (GLOVE) ×3 IMPLANT
GLOVE BIOGEL PI IND STRL 7.0 (GLOVE) ×2 IMPLANT
GLOVE BIOGEL PI INDICATOR 7.0 (GLOVE) ×4
GOWN STRL REUS W/TWL LRG LVL3 (GOWN DISPOSABLE) ×6 IMPLANT
PACK VAGINAL MINOR WOMEN LF (CUSTOM PROCEDURE TRAY) ×3 IMPLANT
PAD OB MATERNITY 4.3X12.25 (PERSONAL CARE ITEMS) ×3 IMPLANT
SET GENESYS HTA PROCERVA (MISCELLANEOUS) ×3 IMPLANT
TOWEL OR 17X24 6PK STRL BLUE (TOWEL DISPOSABLE) ×6 IMPLANT

## 2017-05-16 NOTE — Anesthesia Procedure Notes (Signed)
Procedure Name: LMA Insertion Date/Time: 05/16/2017 12:51 PM Performed by: Cleda Clarks Pre-anesthesia Checklist: Patient identified, Emergency Drugs available, Suction available and Patient being monitored Patient Re-evaluated:Patient Re-evaluated prior to induction Oxygen Delivery Method: Circle system utilized Preoxygenation: Pre-oxygenation with 100% oxygen Induction Type: IV induction Ventilation: Mask ventilation without difficulty LMA: LMA inserted LMA Size: 4.0 Number of attempts: 1 Placement Confirmation: positive ETCO2 Tube secured with: Tape Dental Injury: Teeth and Oropharynx as per pre-operative assessment

## 2017-05-16 NOTE — Interval H&P Note (Signed)
History and Physical Interval Note:  05/16/2017 12:32 PM  Kirsten [name]  has presented today for surgery, with the diagnosis of N92.0 Menorrhagia  The various methods of treatment have been discussed with the patient and family. After consideration of risks, benefits and other options for treatment, the patient has consented to  Procedure(s): DILATATION & CURETTAGE/HYSTEROSCOPY WITH HYDROTHERMAL ABLATION (N/A) as a surgical intervention .  The patient's history has been reviewed, patient examined, no change in status, stable for surgery.  I have reviewed the patient's chart and labs.  Questions were answered to the patient's satisfaction.     Dion Body, Kirsten [name]

## 2017-05-16 NOTE — Transfer of Care (Signed)
Immediate Anesthesia Transfer of Care Note  Patient: Kirsten [name]  Procedure(s) Performed: Procedure(s): DILATATION & CURETTAGE/HYSTEROSCOPY WITH HYDROTHERMAL ABLATION (N/A)  Patient Location: PACU  Anesthesia Type:General  Level of Consciousness: awake, alert  and oriented  Airway & Oxygen Therapy: Patient Spontanous Breathing and Patient connected to nasal cannula oxygen  Post-op Assessment: Report given to RN and Post -op Vital signs reviewed and stable  Post vital signs: Reviewed and stable  Last Vitals:  Vitals:   05/16/17 1124  BP: (!) 122/91  Pulse: 92  Resp: 18  Temp: 36.7 C  SpO2: 99%    Last Pain:  Vitals:   05/16/17 1124  TempSrc: Oral      Patients Stated Pain Goal: 4 (05/16/17 1124)  Complications: No apparent anesthesia complications

## 2017-05-16 NOTE — Addendum Note (Signed)
Addendum  created 05/16/17 1458 by Armanda Heritage, CRNA   Anesthesia Staff edited

## 2017-05-16 NOTE — Anesthesia Preprocedure Evaluation (Signed)
Anesthesia Evaluation  Patient identified by MRN, date of birth, ID band Patient awake    Reviewed: Allergy & Precautions, NPO status , Patient's Chart, lab work & pertinent test results  Airway Mallampati: III  TM Distance: >3 FB Neck ROM: Full    Dental no notable dental hx. (+) Teeth Intact   Pulmonary  Chronic cough   Pulmonary exam normal breath sounds clear to auscultation       Cardiovascular hypertension, Pt. on medications Normal cardiovascular exam Rhythm:Regular Rate:Normal     Neuro/Psych negative neurological ROS  negative psych ROS   GI/Hepatic GERD  Medicated and Controlled,  Endo/Other  diabetes, Poorly Controlled, Type 2, Oral Hypoglycemic AgentsMorbid obesityHyperlipidemia  Renal/GU   negative genitourinary   Musculoskeletal negative musculoskeletal ROS (+)   Abdominal (+) + obese,   Peds  Hematology   Anesthesia Other Findings   Reproductive/Obstetrics Menorrhagia                             Anesthesia Physical Anesthesia Plan  ASA: III  Anesthesia Plan: General   Post-op Pain Management:    Induction: Intravenous  PONV Risk Score and Plan: 4 or greater and Ondansetron, Dexamethasone, Midazolam, Scopolamine patch - Pre-op, Propofol infusion and Metaclopromide  Airway Management Planned: LMA  Additional Equipment:   Intra-op Plan:   Post-operative Plan: Extubation in OR  Informed Consent: I have reviewed the patients History and Physical, chart, labs and discussed the procedure including the risks, benefits and alternatives for the proposed anesthesia with the patient or authorized representative who has indicated his/her understanding and acceptance.   Dental advisory given  Plan Discussed with: CRNA, Anesthesiologist and Surgeon  Anesthesia Plan Comments:         Anesthesia Quick Evaluation

## 2017-05-16 NOTE — Anesthesia Postprocedure Evaluation (Signed)
Anesthesia Post Note  Patient: Kirsten [name]  Procedure(s) Performed: Procedure(s) (LRB): DILATATION & CURETTAGE/HYSTEROSCOPY WITH HYDROTHERMAL ABLATION (N/A)     Patient location during evaluation: PACU Anesthesia Type: General Level of consciousness: awake and alert Pain management: pain level controlled Vital Signs Assessment: post-procedure vital signs reviewed and stable Respiratory status: spontaneous breathing, nonlabored ventilation and respiratory function stable Cardiovascular status: blood pressure returned to baseline and stable Postop Assessment: no signs of nausea or vomiting Anesthetic complications: no    Last Vitals:  Vitals:   05/16/17 1124  BP: (!) 122/91  Pulse: 92  Resp: 18  Temp: 36.7 C  SpO2: 99%    Last Pain:  Vitals:   05/16/17 1124  TempSrc: Oral   Pain Goal: Patients Stated Pain Goal: 4 (05/16/17 1124)               Ahniya Mitchum A.

## 2017-05-16 NOTE — Discharge Instructions (Addendum)
DISCHARGE INSTRUCTIONS: D&C/ HYSTEROSCOPY / ENDOMETRIAL ABLATION The following instructions have been prepared to help you care for yourself upon your return home.  May take stool softner while taking narcotic pain medication to prevent constipation.  Drink plenty of water.  Personal hygiene:  Use sanitary pads for vaginal drainage, not tampons.  Shower the day after your procedure.  NO tub baths, pools or Jacuzzis for 2-3 weeks.  Wipe front to back after using the bathroom.  Activity and limitations:  Do NOT drive or operate any equipment for 24 hours. The effects of anesthesia are still present and drowsiness may result.  Do NOT rest in bed all day.  Walking is encouraged.  Walk up and down stairs slowly.  You may resume your normal activity in one to two days or as indicated by your physician. Sexual activity: NO intercourse for at least 2 weeks after the procedure, or as indicated by your Doctor.  Diet: Eat a light meal as desired this evening. You may resume your usual diet tomorrow.  Return to Work: You may resume your work activities in one to two days or as indicated by Therapist, sports.  What to expect after your surgery: Expect to have vaginal bleeding/discharge for 2-3 days and spotting for up to 10 days. It is not unusual to have soreness for up to 1-2 weeks. You may have a slight burning sensation when you urinate for the first day. Mild cramps may continue for a couple of days. You may have a regular period in 2-6 weeks.  Call your doctor for any of the following:  Excessive vaginal bleeding or clotting, saturating and changing one pad every hour.  Inability to urinate 6 hours after discharge from hospital.  Pain not relieved by pain medication.  Fever of 100.4 F or greater.  Unusual vaginal discharge or odor.  Patients signature: ______________________ Nurses signature ________________________ Support person's signature_______________________  Post  Anesthesia Home Care Instructions  NO IBUPROFEN PRODUCTS UNTIL: 7:30 PM TONIGHT  Activity: Get plenty of rest for the remainder of the day. A responsible individual must stay with you for 24 hours following the procedure.  For the next 24 hours, DO NOT: -Drive a car -Advertising copywriter -Drink alcoholic beverages -Take any medication unless instructed by your physician -Make any legal decisions or sign important papers.  Meals: Start with liquid foods such as gelatin or soup. Progress to regular foods as tolerated. Avoid greasy, spicy, heavy foods. If nausea and/or vomiting occur, drink only clear liquids until the nausea and/or vomiting subsides. Call your physician if vomiting continues.  Special Instructions/Symptoms: Your throat may feel dry or sore from the anesthesia or the breathing tube placed in your throat during surgery. If this causes discomfort, gargle with warm salt water. The discomfort should disappear within 24 hours.  If you had a scopolamine patch placed behind your ear for the management of post- operative nausea and/or vomiting:  1. The medication in the patch is effective for 72 hours, after which it should be removed.  Wrap patch in a tissue and discard in the trash. Wash hands thoroughly with soap and water. 2. You may remove the patch earlier than 72 hours if you experience unpleasant side effects which may include dry mouth, dizziness or visual disturbances. 3. Avoid touching the patch. Wash your hands with soap and water after contact with the patch.

## 2017-05-16 NOTE — Brief Op Note (Signed)
05/16/2017  1:42 PM  PATIENT:  Kirsten [name]  46 y.o. female  PRE-OPERATIVE DIAGNOSIS:  N92.0 Menorrhagia  POST-OPERATIVE DIAGNOSIS:  N92.0 Menorrhagia  PROCEDURE:  Procedure(s): DILATATION & CURETTAGE/HYSTEROSCOPY WITH HYDROTHERMAL ABLATION (N/A)  SURGEON:  Surgeon(s) and Role:    Geryl Rankins, MD - Primary  PHYSICIAN ASSISTANT: None  ASSISTANTS: none   ANESTHESIA:   general  EBL:  Total I/O In: 1000 [I.V.:1000] Out: 170 [Urine:150; Blood:20]  BLOOD ADMINISTERED:none  DRAINS: none   LOCAL MEDICATIONS USED:  LIDOCAINE 10 ml  SPECIMEN:  Source of Specimen:  endometrial currettings  DISPOSITION OF SPECIMEN:  PATHOLOGY  COUNTS:  YES  TOURNIQUET:  * No tourniquets in log *  DICTATION: .Other Dictation: Dictation Number [id number]  PLAN OF CARE: Discharge to home after PACU  PATIENT DISPOSITION:  PACU - hemodynamically stable.   Delay start of Pharmacological VTE agent (>24hrs) due to surgical blood loss or risk of bleeding: yes

## 2017-05-17 ENCOUNTER — Encounter (HOSPITAL_COMMUNITY): Payer: Self-pay | Admitting: Obstetrics and Gynecology

## 2017-05-17 NOTE — Op Note (Signed)
NAME:  Kirsten, [name] NO.:  [id number]  MEDICAL RECORD NO.:  [id number]  LOCATION:                                 FACILITY:  PHYSICIAN:  Pieter Partridge, MD        DATE OF BIRTH:  DATE OF PROCEDURE:  05/16/2017 DATE OF DISCHARGE:                              OPERATIVE REPORT   PREOPERATIVE DIAGNOSIS:  Menorrhagia.  POSTOPERATIVE DIAGNOSIS:  Menorrhagia.  PROCEDURE:  Hysteroscopy, D and C with HCA endometrial ablation.  SURGEON:  Pieter Partridge, MD.  ASSISTANT:  None.  ANESTHESIA:  General.  ESTIMATED BLOOD LOSS:  20 mL.  DRAINS:  None.  LOCAL:  Lidocaine 2%, 10 mL.  SPECIMEN:  Endometrial curettings.  DISPOSITION OF SPECIMEN:  To pathology.  PATIENT DISPOSITION:  To PACU, hemodynamically stable.  COMPLICATIONS:  None.  FINDINGS:  Normal smooth appearing endometrial cavity.  DESCRIPTION OF PROCEDURE:  The patient was identified in the holding area.  She was then taken to the operating room with IV running.  She was placed in the dorsal lithotomy position and underwent LMA anesthesia.  SCDs were on and operating.  She was then prepped and draped in a normal sterile fashion, a time-out was performed.  Graves speculum was inserted into the vagina.  Cervix was identified. Paracervical block was performed with lidocaine.  Anterior lip of the cervix was grasped with a single-tooth tenaculum.  The cervix was easily dilated up to a 6.  The hysteroscope with the HTA device was advanced easily through the cervix, endometrial cavity with the findings above were noted.  The ablation device was then activated and the ablation proceeded without alarm.  There was a very adequate and appropriate response to the ablation and it appeared to be a very successful ablation.   Survey of cavity was performed.  Hysteroscope was then removed.  Curettage of the uterus was performed.  Specimen was sent to path.  Single-tooth tenaculum was removed.  Tenaculum  site was hemostatic.  All instrument and sponge counts were correct x3.  The patient was taken to the recovery room in stable condition.     Pieter Partridge, MD   ______________________________ Pieter Partridge, MD    EBV/MEDQ  D:  05/16/2017  T:  05/16/2017  Job:  [id number]

## 2018-05-14 ENCOUNTER — Other Ambulatory Visit: Payer: Self-pay | Admitting: Endocrinology

## 2018-05-14 DIAGNOSIS — E041 Nontoxic single thyroid nodule: Secondary | ICD-10-CM

## 2018-05-20 ENCOUNTER — Ambulatory Visit
Admission: RE | Admit: 2018-05-20 | Discharge: 2018-05-20 | Disposition: A | Discharge status: Home / Self Care | Payer: No Typology Code available for payment source | Source: Ambulatory Visit | Attending: Endocrinology | Admitting: Endocrinology

## 2018-05-20 DIAGNOSIS — E041 Nontoxic single thyroid nodule: Secondary | ICD-10-CM

## 2019-03-31 ENCOUNTER — Other Ambulatory Visit: Payer: Self-pay | Admitting: Obstetrics and Gynecology

## 2019-03-31 ENCOUNTER — Other Ambulatory Visit (HOSPITAL_COMMUNITY)
Admission: RE | Admit: 2019-03-31 | Discharge: 2019-03-31 | Disposition: A | Discharge status: Home / Self Care | Payer: PRIVATE HEALTH INSURANCE | Source: Ambulatory Visit | Attending: Obstetrics and Gynecology | Admitting: Obstetrics and Gynecology

## 2019-03-31 DIAGNOSIS — Z124 Encounter for screening for malignant neoplasm of cervix: Secondary | ICD-10-CM | POA: Insufficient documentation

## 2019-04-02 LAB — CYTOLOGY - PAP
Diagnosis: NEGATIVE
HPV: NOT DETECTED

## 2019-07-23 ENCOUNTER — Other Ambulatory Visit: Payer: Self-pay | Admitting: Family Medicine

## 2019-07-23 ENCOUNTER — Other Ambulatory Visit: Payer: Self-pay

## 2019-07-23 ENCOUNTER — Ambulatory Visit
Admission: RE | Admit: 2019-07-23 | Discharge: 2019-07-23 | Disposition: A | Discharge status: Home / Self Care | Payer: PRIVATE HEALTH INSURANCE | Source: Ambulatory Visit | Attending: Family Medicine | Admitting: Family Medicine

## 2019-07-23 DIAGNOSIS — M79671 Pain in right foot: Secondary | ICD-10-CM

## 2020-04-06 ENCOUNTER — Other Ambulatory Visit: Payer: Self-pay | Admitting: Endocrinology

## 2020-04-06 DIAGNOSIS — E041 Nontoxic single thyroid nodule: Secondary | ICD-10-CM

## 2020-04-13 ENCOUNTER — Ambulatory Visit
Admission: RE | Admit: 2020-04-13 | Discharge: 2020-04-13 | Disposition: A | Discharge status: Home / Self Care | Payer: PRIVATE HEALTH INSURANCE | Source: Ambulatory Visit | Attending: Endocrinology | Admitting: Endocrinology

## 2020-04-13 ENCOUNTER — Other Ambulatory Visit: Payer: Self-pay

## 2020-04-13 DIAGNOSIS — E041 Nontoxic single thyroid nodule: Secondary | ICD-10-CM

## 2020-04-21 ENCOUNTER — Other Ambulatory Visit: Payer: Self-pay | Admitting: Radiology

## 2021-04-19 ENCOUNTER — Telehealth: Payer: Self-pay | Admitting: Hematology and Oncology

## 2021-04-19 NOTE — Telephone Encounter (Signed)
Kirsten [name] cld to be scheduled in the high risk breast clinic per letter she received from [facility]. I cld and got her scheduled to see Dr. Al Pimple on 8/15 at 10:40am.

## 2021-05-09 ENCOUNTER — Inpatient Hospital Stay
Payer: No Typology Code available for payment source | Attending: Hematology and Oncology | Admitting: Hematology and Oncology

## 2021-05-09 ENCOUNTER — Other Ambulatory Visit: Payer: Self-pay

## 2021-05-09 ENCOUNTER — Encounter: Payer: Self-pay | Admitting: Hematology and Oncology

## 2021-05-09 VITALS — BP 125/85 | HR 93 | Temp 97.4°F | Resp 18 | Wt 240.0 lb

## 2021-05-09 DIAGNOSIS — Z803 Family history of malignant neoplasm of breast: Secondary | ICD-10-CM | POA: Insufficient documentation

## 2021-05-09 DIAGNOSIS — Z8042 Family history of malignant neoplasm of prostate: Secondary | ICD-10-CM | POA: Insufficient documentation

## 2021-05-09 DIAGNOSIS — Z9189 Other specified personal risk factors, not elsewhere classified: Secondary | ICD-10-CM

## 2021-05-09 NOTE — Progress Notes (Signed)
[facility] CONSULT NOTE  Patient Care Team: [name], MD as PCP - General (Family Medicine)  CHIEF COMPLAINTS/PURPOSE OF CONSULTATION:  Increased risk of breast cancer  ASSESSMENT & PLAN:  No problem-specific Assessment & Plan notes found for this encounter.  No orders of the defined types were placed in this encounter.  We discussed her life time risk of breast cancer and 5 yr risk of breast cancer today which is 29% per TC model and 1.9% 5-year risk per Gail's model  2. We discussed different risk reducing strategies for future breast cancer risk. We discussed chemoprevention and lifestyle modification. We discussed role of ongoing surveillance with mammograms versus MRIs. Patient would be a good candidate for chemoprevention with  tamoxifen.  With tamoxifen side effects would include but not limited to cataracts, increased risk of DVT/PE/cardiovascular events, uterine malignancy, hot flashes, and mood swings.  She would like to think about tamoxifen since her sister had a blood clot while she was taking tamoxifen.  3. Lifestyle modification: We discussed different interventions exercise at least 5 days a week including both aerobic as well as weight-bearing exercises and strength training. He discussed dietary modification including increasing number of servings of fruit and vegetables as well as decreased in number of servings of meat. We discussed the importance of maintaining a good BMI and limiting alcohol intake.  4. Surveillance: Patient certainly would be a good candidate for ongoing mammograms and MRIs on a yearly basis.  She recently had a mammogram, ordered her next MRI in February 2023.  5. Chemoprevention: Discussed about tamoxifen prevention given her 5-year risk of breast cancer per Gail's model is about 1.9%.  6. Genetics: Genetic testing, she does not qualify for genetic testing at this time but have recommended that she let us know if she would like to  be out-of-pocket for the genetic testing.  7. Followup: Patient will be seen back in 6 months time in followup.   With regards to chemoprevention, she would like to think about this.  We will schedule a phone visit in about 4 weeks to discuss her decision.  If she does not want to proceed with chemoprevention, she will proceed MRI in February and return to clinic for follow-up in 3 of 2023.  MRIs unfortunately can lead to more biopsies and false positive testing and there is unknown long-term risk of gadolinium.  HISTORY OF PRESENTING ILLNESS:   Kirsten [name] 50 y.o. female is here because of Increased life time risk of breast cancer per TC model.  This is a very pleasant 50 year old female patient with past medical history significant for diabetes, GERD, hypertension referred to high risk breast clinic given her lifetime risk of breast cancer greater than 20% per [employer] model.  She is healthy otherwise, denies any major complaints except for some breast pain and she had a recent mammogram and ultrasound which did not show any evidence of malignancy.  She did have a breast biopsy last year which showed benign breast parenchyma with duct ectasia, adenosis and pseudo angiomatous stromal hyperplasia.  Sister had breast cancer at the age of 20, had a blood clot with tamoxifen hence now on aromatase inhibitors.  Dad had early stage prostate cancer which was treated with radiation.  No other family history of breast, ovarian, pancreatic cancer or melanoma's.  Rest of the pertinent 10 point ROS reviewed and negative.  REVIEW OF SYSTEMS:   Constitutional: Denies fevers, chills or abnormal night sweats Eyes: Denies blurriness  of vision, double vision or watery eyes Ears, nose, mouth, throat, and face: Denies mucositis or sore throat Respiratory: Denies cough, dyspnea or wheezes Cardiovascular: Denies palpitation, chest discomfort or lower extremity swelling Gastrointestinal:  Denies nausea, heartburn or  change in bowel habits Skin: Denies abnormal skin rashes Lymphatics: Denies new lymphadenopathy or easy bruising Neurological:Denies numbness, tingling or new weaknesses Behavioral/Psych: Mood is stable, no new changes  All other systems were reviewed with the patient and are negative.  MEDICAL HISTORY:  Past Medical History:  Diagnosis Date   Diabetes mellitus without complication (HCC)    GERD (gastroesophageal reflux disease)    Hypertension    Thyroid disease     SURGICAL HISTORY: Past Surgical History:  Procedure Laterality Date   DILITATION & CURRETTAGE/HYSTROSCOPY WITH HYDROTHERMAL ABLATION N/A 05/16/2017   Procedure: DILATATION & CURETTAGE/HYSTEROSCOPY WITH HYDROTHERMAL ABLATION;  Surgeon: Geryl Rankins, MD;  Location: [facility];  Service: Gynecology;  Laterality: N/A;   TUBAL LIGATION  1997    SOCIAL HISTORY: Social History   Socioeconomic History   Marital status: Married    Spouse name: Not on file   Number of children: Not on file   Years of education: Not on file   Highest education level: Not on file  Occupational History   Occupation: CMA  Tobacco Use   Smoking status: Never   Smokeless tobacco: Never  Substance and Sexual Activity   Alcohol use: Yes    Alcohol/week: 0.0 standard drinks    Comment: rare   Drug use: No   Sexual activity: Not on file  Other Topics Concern   Not on file  Social History Narrative   Not on file   Social Determinants of Health   Financial Resource Strain: Not on file  Food Insecurity: Not on file  Transportation Needs: Not on file  Physical Activity: Not on file  Stress: Not on file  Social Connections: Not on file  Intimate Partner Violence: Not on file    FAMILY HISTORY: No family history on file.  ALLERGIES:  is allergic to contrast media [iodinated diagnostic agents].  MEDICATIONS:  Current Outpatient Medications  Medication Sig Dispense Refill   acetaminophen (TYLENOL) 500 MG tablet Take 1,000 mg by  mouth daily as needed for moderate pain.     amLODipine (NORVASC) 5 MG tablet Take 5 mg by mouth daily.     atorvastatin (LIPITOR) 10 MG tablet Take 10 mg by mouth daily.     Cholecalciferol (VITAMIN D) 2000 units CAPS Take 2,000 Units by mouth daily.     FARXIGA 5 MG TABS tablet Take 5 mg by mouth daily.     levothyroxine (SYNTHROID, LEVOTHROID) 25 MCG tablet Take 25 mcg by mouth daily before breakfast.     RYBELSUS 14 MG TABS Take 1 tablet by mouth every morning.     tranexamic acid (LYSTEDA) 650 MG TABS tablet Take 1,300 mg by mouth 3 (three) times daily.     valACYclovir (VALTREX) 500 MG tablet Take 500 mg by mouth daily as needed (outbreak).     No current facility-administered medications for this visit.     PHYSICAL EXAMINATION: ECOG PERFORMANCE STATUS: 0 - Asymptomatic  Vitals:   05/09/21 1100  BP: 125/85  Pulse: 93  Resp: 18  Temp: (!) 97.4 F (36.3 C)  SpO2: 94%   Filed Weights   05/09/21 1100  Weight: 240 lb (108.9 kg)    GENERAL:alert, no distress and comfortable Breast exam: Bilateral breasts inspected.  Right breast marker could  be palpated.  No other densities noted in the right breast.  Nipple looks healthy.  No regional adenopathy. Lower extremities.  No bilateral lower extremity edema.  LABORATORY DATA:  I have reviewed the data as listed Lab Results  Component Value Date   WBC 8.4 05/08/2017   HGB 12.3 05/08/2017   HCT 36.2 05/08/2017   MCV 76.5 (L) 05/08/2017   PLT 262 05/08/2017     Chemistry      Component Value Date/Time   NA 137 05/08/2017 0908   K 3.9 05/08/2017 0908   CL 102 05/08/2017 0908   CO2 26 05/08/2017 0908   BUN 8 05/08/2017 0908   CREATININE 0.80 05/08/2017 0908      Component Value Date/Time   CALCIUM 9.0 05/08/2017 0908     We have calculated her lifetime risk of breast cancer per TC model which comes to around 29%.  5-year risk of breast cancer per Gail's model comes to around 1.9%.  RADIOGRAPHIC STUDIES: I have  personally reviewed the radiological images as listed and agreed with the findings in the report. No results found.  All questions were answered. The patient knows to call the clinic with any problems, questions or concerns. I spent 45 minutes in the care of this patient including H and P, review of records, counseling and coordination of care.     Rachel Moulds, MD 05/09/2021 11:11 AM

## 2021-06-06 ENCOUNTER — Telehealth: Payer: Self-pay | Admitting: Hematology and Oncology

## 2021-06-06 NOTE — Telephone Encounter (Signed)
Called patient regarding upcoming September and February appointments, left a voicemail.

## 2021-06-07 ENCOUNTER — Inpatient Hospital Stay: Payer: No Typology Code available for payment source | Admitting: Hematology and Oncology

## 2021-06-08 ENCOUNTER — Inpatient Hospital Stay
Payer: No Typology Code available for payment source | Attending: Hematology and Oncology | Admitting: Hematology and Oncology

## 2021-06-08 ENCOUNTER — Encounter: Payer: Self-pay | Admitting: Hematology and Oncology

## 2021-06-08 DIAGNOSIS — Z9189 Other specified personal risk factors, not elsewhere classified: Secondary | ICD-10-CM | POA: Diagnosis not present

## 2021-06-08 NOTE — Progress Notes (Signed)
[facility] CONSULT NOTE  Patient Care Team: Kirsten Montana, MD as PCP - General (Family Medicine)  CHIEF COMPLAINTS/PURPOSE OF CONSULTATION:  Increased risk of breast cancer  ASSESSMENT & PLAN:  This is a very pleasant 50 year old female patient who was referred to high risk breast clinic given her lifetime risk of breast cancer greater than 20% and we had a discussion about various measures to reduce her risk of breast cancer.  We are doing a telephone call today to follow-up about her decision on taking tamoxifen.  She has been doing quite well since her last visit with [place].  She tells me that she is had enough time to read about it and review her decision with her family and at this time she is not ready to try to the tamoxifen. She is however willing to follow-up with high risk breast clinic with periodic MRIs and mammograms as well as breast exams.  She is currently going to have her MRI done in February 2023 and she will follow-up with me after February 2023's MRI.  In the meantime I have encouraged her to do self breast exam and report any new breast changes to [place] immediately.  She also mentioned that her sister had genetic testing and her genetic testing was negative for any pathogenic mutations. Return to clinic in February as scheduled.  HISTORY OF PRESENTING ILLNESS:   Kirsten [name] 50 y.o. female is here because of Increased life time risk of breast cancer per TC model   Interval history [name] is here for telephone follow-up.  She has been doing quite well since her last visit.  No new health complaints.  No hospitalizations or change in medications or new allergies.  MEDICAL HISTORY:  Past Medical History:  Diagnosis Date   Diabetes mellitus without complication (HCC)    GERD (gastroesophageal reflux disease)    Hypertension    Thyroid disease     SURGICAL HISTORY: Past Surgical History:  Procedure Laterality Date   DILITATION & CURRETTAGE/HYSTROSCOPY  WITH HYDROTHERMAL ABLATION N/A 05/16/2017   Procedure: DILATATION & CURETTAGE/HYSTEROSCOPY WITH HYDROTHERMAL ABLATION;  Surgeon: Geryl Rankins, MD;  Location: [facility];  Service: Gynecology;  Laterality: N/A;   TUBAL LIGATION  1997    SOCIAL HISTORY: Social History   Socioeconomic History   Marital status: Married    Spouse name: Not on file   Number of children: Not on file   Years of education: Not on file   Highest education level: Not on file  Occupational History   Occupation: CMA  Tobacco Use   Smoking status: Never   Smokeless tobacco: Never  Substance and Sexual Activity   Alcohol use: Yes    Alcohol/week: 0.0 standard drinks    Comment: rare   Drug use: No   Sexual activity: Not on file  Other Topics Concern   Not on file  Social History Narrative   Not on file   Social Determinants of Health   Financial Resource Strain: Not on file  Food Insecurity: Not on file  Transportation Needs: Not on file  Physical Activity: Not on file  Stress: Not on file  Social Connections: Not on file  Intimate Partner Violence: Not on file    FAMILY HISTORY: No family history on file.  ALLERGIES:  is allergic to contrast media [iodinated diagnostic agents].  MEDICATIONS:  Current Outpatient Medications  Medication Sig Dispense Refill   acetaminophen (TYLENOL) 500 MG tablet Take 1,000 mg by mouth daily as needed  for moderate pain.     amLODipine (NORVASC) 5 MG tablet Take 5 mg by mouth daily.     atorvastatin (LIPITOR) 10 MG tablet Take 10 mg by mouth daily.     Cholecalciferol (VITAMIN D) 2000 units CAPS Take 2,000 Units by mouth daily.     FARXIGA 5 MG TABS tablet Take 5 mg by mouth daily.     levothyroxine (SYNTHROID, LEVOTHROID) 25 MCG tablet Take 25 mcg by mouth daily before breakfast.     RYBELSUS 14 MG TABS Take 1 tablet by mouth every morning.     tranexamic acid (LYSTEDA) 650 MG TABS tablet Take 1,300 mg by mouth 3 (three) times daily.     valACYclovir (VALTREX)  500 MG tablet Take 500 mg by mouth daily as needed (outbreak).     No current facility-administered medications for this visit.     PHYSICAL EXAMINATION: ECOG PERFORMANCE STATUS: 0 - Asymptomatic  There were no vitals filed for this visit.  There were no vitals filed for this visit.   Physical exam deferred, telephone visit. LABORATORY DATA:  I have reviewed the data as listed Lab Results  Component Value Date   WBC 8.4 05/08/2017   HGB 12.3 05/08/2017   HCT 36.2 05/08/2017   MCV 76.5 (L) 05/08/2017   PLT 262 05/08/2017     Chemistry      Component Value Date/Time   NA 137 05/08/2017 0908   K 3.9 05/08/2017 0908   CL 102 05/08/2017 0908   CO2 26 05/08/2017 0908   BUN 8 05/08/2017 0908   CREATININE 0.80 05/08/2017 0908      Component Value Date/Time   CALCIUM 9.0 05/08/2017 0908     We have calculated her lifetime risk of breast cancer per TC model which comes to around 29%.  5-year risk of breast cancer per Gail's model comes to around 1.9%.  I connected with  Kirsten [name] on 06/08/21 by a telephone application and verified that I am speaking with the correct person using two identifiers.   I discussed the limitations of evaluation and management by telemedicine. The patient expressed understanding and agreed to proceed.   RADIOGRAPHIC STUDIES: I have personally reviewed the radiological images as listed and agreed with the findings in the report. No results found.  All questions were answered. The patient knows to call the clinic with any problems, questions or concerns. I spent 15 minutes in the care of this patient including H and P, review of records, counseling and coordination of care. Of this at least half has been spent on the phone.    Rachel Moulds, MD 06/08/2021 1:02 PM

## 2021-09-09 ENCOUNTER — Other Ambulatory Visit (HOSPITAL_COMMUNITY): Payer: Self-pay

## 2021-09-09 MED ORDER — MOUNJARO 5 MG/0.5ML ~~LOC~~ SOAJ
SUBCUTANEOUS | 0 refills | Status: DC
  Filled 2021-09-09: qty 2, 30d supply, fill #0

## 2021-09-15 ENCOUNTER — Other Ambulatory Visit (HOSPITAL_COMMUNITY): Payer: Self-pay

## 2021-09-15 MED ORDER — AMLODIPINE BESYLATE 5 MG PO TABS: 5.0000 mg | ORAL_TABLET | Freq: Every day | ORAL | 0 refills | Status: DC

## 2021-09-15 MED ORDER — ATORVASTATIN CALCIUM 10 MG PO TABS: 10.0000 mg | ORAL_TABLET | Freq: Every day | ORAL | 2 refills | Status: DC

## 2021-09-16 ENCOUNTER — Other Ambulatory Visit (HOSPITAL_COMMUNITY): Payer: Self-pay

## 2021-09-21 ENCOUNTER — Other Ambulatory Visit (HOSPITAL_COMMUNITY): Payer: Self-pay

## 2021-10-13 ENCOUNTER — Other Ambulatory Visit (HOSPITAL_COMMUNITY): Payer: Self-pay

## 2021-10-13 MED ORDER — FARXIGA 5 MG PO TABS
ORAL_TABLET | ORAL | 2 refills | Status: DC
  Filled 2021-10-13: qty 30, 30d supply, fill #0
  Filled 2021-11-24: qty 30, 30d supply, fill #1
  Filled 2022-01-16 – 2022-02-15 (×2): qty 30, 30d supply, fill #2

## 2021-10-13 MED ORDER — MOUNJARO 5 MG/0.5ML ~~LOC~~ SOAJ
SUBCUTANEOUS | 2 refills | Status: DC
  Filled 2021-10-13: qty 2, 28d supply, fill #0

## 2021-10-21 ENCOUNTER — Other Ambulatory Visit (HOSPITAL_COMMUNITY): Payer: Self-pay

## 2021-10-24 ENCOUNTER — Telehealth: Payer: Self-pay | Admitting: Hematology and Oncology

## 2021-10-24 NOTE — Telephone Encounter (Signed)
Scheduled appointment per providers schedule. Left message.

## 2021-11-03 ENCOUNTER — Ambulatory Visit (HOSPITAL_COMMUNITY): Payer: No Typology Code available for payment source

## 2021-11-07 ENCOUNTER — Encounter: Payer: Self-pay | Admitting: Hematology and Oncology

## 2021-11-07 ENCOUNTER — Other Ambulatory Visit: Payer: Self-pay

## 2021-11-07 ENCOUNTER — Inpatient Hospital Stay: Payer: No Typology Code available for payment source | Admitting: Hematology and Oncology

## 2021-11-07 ENCOUNTER — Inpatient Hospital Stay
Payer: No Typology Code available for payment source | Attending: Hematology and Oncology | Admitting: Hematology and Oncology

## 2021-11-07 VITALS — BP 124/79 | HR 90 | Temp 97.9°F | Resp 18 | Ht 63.0 in | Wt 242.7 lb

## 2021-11-07 DIAGNOSIS — N644 Mastodynia: Secondary | ICD-10-CM | POA: Diagnosis present

## 2021-11-07 DIAGNOSIS — Z9189 Other specified personal risk factors, not elsewhere classified: Secondary | ICD-10-CM | POA: Insufficient documentation

## 2021-11-07 NOTE — Progress Notes (Signed)
[facility] NOTE  Patient Care Team: Harlan Stains, MD as PCP - General (Family Medicine)  CHIEF COMPLAINTS/PURPOSE OF CONSULTATION:  Increased risk of breast cancer  ASSESSMENT & PLAN:   This is a very pleasant 51 year old female patient who was referred to high risk breast clinic given her lifetime risk of breast cancer greater than 20% decline in tamoxifen endocrine prevention who is here for follow-up. Since last visit, she reports abnormal mammogram in her mother.  She also has MRI breast scheduled for next week, this was supposed to be done last week but has been postponed to this week.  She has not felt any breast lumps however feels occasional breast pain in the right breast.  No other changes in health. Physical examination today without any concerning breast findings.  No regional adenopathy. Okay to proceed with MRI as scheduled, her repeat mammogram will be due in August 2023.  She should return to clinic in about 6 months for follow-up.  She expressed understanding of all the recommendations.  She will return to clinic in 6 months.  She was encouraged to reach out to [place] to review the MRI report and she expressed understanding.  HISTORY OF PRESENTING ILLNESS:   Kirsten [name] 51 y.o. female is here because of Increased life time risk of breast cancer per TC model   Interval history Kirsten [name] is here for telephone follow-up.  She has been doing quite well since her last visit.  Since last visit, she has noticed some random breast pains in the right breast without an obvious mass.  She is also worried because her mom was called back for an abnormal mammogram.  She denies any other interim infections or hospitalizations. No new medications.  Rest of the pertinent 10 point ROS reviewed and negative.  MEDICAL HISTORY:  Past Medical History:  Diagnosis Date   Diabetes mellitus without complication ([place])    GERD (gastroesophageal reflux disease)     Hypertension    Thyroid disease     SURGICAL HISTORY: Past Surgical History:  Procedure Laterality Date   DILITATION & CURRETTAGE/HYSTROSCOPY WITH HYDROTHERMAL ABLATION N/A 05/16/2017   Procedure: DILATATION & CURETTAGE/HYSTEROSCOPY WITH HYDROTHERMAL ABLATION;  Surgeon: Thurnell Lose, MD;  Location: [facility];  Service: Gynecology;  Laterality: N/A;   TUBAL LIGATION  1997    SOCIAL HISTORY: Social History   Socioeconomic History   Marital status: Married    Spouse name: Not on file   Number of children: Not on file   Years of education: Not on file   Highest education level: Not on file  Occupational History   Occupation: CMA  Tobacco Use   Smoking status: Never   Smokeless tobacco: Never  Substance and Sexual Activity   Alcohol use: Yes    Alcohol/week: 0.0 standard drinks    Comment: rare   Drug use: No   Sexual activity: Not on file  Other Topics Concern   Not on file  Social History Narrative   Not on file   Social Determinants of Health   Financial Resource Strain: Not on file  Food Insecurity: Not on file  Transportation Needs: Not on file  Physical Activity: Not on file  Stress: Not on file  Social Connections: Not on file  Intimate Partner Violence: Not on file    FAMILY HISTORY: No family history on file.  ALLERGIES:  is allergic to contrast media [iodinated contrast media].  MEDICATIONS:  Current Outpatient Medications  Medication Sig Dispense Refill  acetaminophen (TYLENOL) 500 MG tablet Take 1,000 mg by mouth daily as needed for moderate pain.     amLODipine (NORVASC) 5 MG tablet Take 5 mg by mouth daily.     amLODipine (NORVASC) 5 MG tablet Take 1 tablet (5 mg total) by mouth daily. 90 tablet 0   atorvastatin (LIPITOR) 10 MG tablet Take 10 mg by mouth daily.     atorvastatin (LIPITOR) 10 MG tablet Take 1 tablet (10 mg total) by mouth daily. 90 tablet 2   Cholecalciferol (VITAMIN D) 2000 units CAPS Take 2,000 Units by mouth daily.      dapagliflozin propanediol (FARXIGA) 5 MG TABS tablet Take 1 tablet by mouth once daily 30 tablet 2   FARXIGA 5 MG TABS tablet Take 5 mg by mouth daily.     levothyroxine (SYNTHROID, LEVOTHROID) 25 MCG tablet Take 25 mcg by mouth daily before breakfast.     RYBELSUS 14 MG TABS Take 1 tablet by mouth every morning.     tirzepatide [facility]) 5 MG/0.5ML Pen Inject 1 pen into the skin once a week 2 mL 0   tirzepatide (MOUNJARO) 5 MG/0.5ML Pen Inject under the skin once weekly as directed 2 mL 2   tranexamic acid (LYSTEDA) 650 MG TABS tablet Take 1,300 mg by mouth 3 (three) times daily.     valACYclovir (VALTREX) 500 MG tablet Take 500 mg by mouth daily as needed (outbreak).     No current facility-administered medications for this visit.     PHYSICAL EXAMINATION: ECOG PERFORMANCE STATUS: 0 - Asymptomatic  Vitals:   11/07/21 1349  BP: 124/79  Pulse: 90  Resp: 18  Temp: 97.9 F (36.6 C)  SpO2: 95%    Filed Weights   11/07/21 1349  Weight: 242 lb 11.2 oz (110.1 kg)   Physical Exam Constitutional:      Appearance: Normal appearance.  Cardiovascular:     Rate and Rhythm: Normal rate and regular rhythm.  Chest:     Comments: Bilateral breasts inspected.  Normal inspection.  On palpation there is no definitive masses or skin changes or nipple changes.  No palpable lymphadenopathy Musculoskeletal:     Cervical back: Normal range of motion and neck supple. No rigidity.  Lymphadenopathy:     Cervical: No cervical adenopathy.  Neurological:     Mental Status: She is alert.    LABORATORY DATA:  I have reviewed the data as listed Lab Results  Component Value Date   WBC 8.4 05/08/2017   HGB 12.3 05/08/2017   HCT 36.2 05/08/2017   MCV 76.5 (L) 05/08/2017   PLT 262 05/08/2017     Chemistry      Component Value Date/Time   NA 137 05/08/2017 0908   K 3.9 05/08/2017 0908   CL 102 05/08/2017 0908   CO2 26 05/08/2017 0908   BUN 8 05/08/2017 0908   CREATININE 0.80 05/08/2017 0908       Component Value Date/Time   CALCIUM 9.0 05/08/2017 0908     RADIOGRAPHIC STUDIES: I have personally reviewed the radiological images as listed and agreed with the findings in the report. No results found.  All questions were answered. The patient knows to call the clinic with any problems, questions or concerns. I spent 20 minutes in the care of this patient including H and P, review of records, counseling and coordination of care.     Benay Pike, MD 11/07/2021 1:52 PM

## 2021-11-10 ENCOUNTER — Ambulatory Visit (HOSPITAL_COMMUNITY)
Admission: RE | Admit: 2021-11-10 | Discharge: 2021-11-10 | Disposition: A | Discharge status: Home / Self Care | Payer: No Typology Code available for payment source | Source: Ambulatory Visit | Attending: Hematology and Oncology | Admitting: Hematology and Oncology

## 2021-11-10 ENCOUNTER — Other Ambulatory Visit: Payer: Self-pay

## 2021-11-10 ENCOUNTER — Other Ambulatory Visit: Payer: Self-pay | Admitting: Hematology and Oncology

## 2021-11-10 DIAGNOSIS — Z9189 Other specified personal risk factors, not elsewhere classified: Secondary | ICD-10-CM | POA: Diagnosis present

## 2021-11-10 MED ORDER — GADOBUTROL 1 MMOL/ML IV SOLN
10.0000 mL | Freq: Once | INTRAVENOUS | Status: AC | PRN
  Administered 2021-11-10: 10 mL via INTRAVENOUS

## 2021-11-10 NOTE — Progress Notes (Signed)
Mammogram ordered.  Kirsten [name]

## 2021-11-15 ENCOUNTER — Other Ambulatory Visit (HOSPITAL_COMMUNITY): Payer: No Typology Code available for payment source

## 2021-11-24 ENCOUNTER — Other Ambulatory Visit: Payer: Self-pay

## 2021-11-24 ENCOUNTER — Other Ambulatory Visit (HOSPITAL_COMMUNITY): Payer: Self-pay

## 2021-11-24 MED ORDER — MOUNJARO 5 MG/0.5ML ~~LOC~~ SOAJ
SUBCUTANEOUS | 2 refills | Status: DC
  Filled 2021-11-24: qty 2, 28d supply, fill #0

## 2021-11-25 ENCOUNTER — Other Ambulatory Visit (HOSPITAL_COMMUNITY): Payer: Self-pay

## 2021-11-25 MED ORDER — LEVOTHYROXINE SODIUM 25 MCG PO TABS
ORAL_TABLET | ORAL | 1 refills | Status: DC
  Filled 2021-11-25: qty 90, 90d supply, fill #0

## 2021-12-16 ENCOUNTER — Other Ambulatory Visit (HOSPITAL_COMMUNITY): Payer: Self-pay

## 2021-12-29 ENCOUNTER — Encounter: Payer: Self-pay | Admitting: Hematology and Oncology

## 2022-01-16 ENCOUNTER — Other Ambulatory Visit (HOSPITAL_COMMUNITY): Payer: Self-pay

## 2022-01-25 ENCOUNTER — Other Ambulatory Visit (HOSPITAL_COMMUNITY): Payer: Self-pay

## 2022-02-15 ENCOUNTER — Other Ambulatory Visit (HOSPITAL_COMMUNITY): Payer: Self-pay

## 2022-02-15 MED ORDER — MOUNJARO 2.5 MG/0.5ML ~~LOC~~ SOAJ
SUBCUTANEOUS | 0 refills | Status: DC
  Filled 2022-02-15: qty 2, 28d supply, fill #0

## 2022-02-15 MED ORDER — AMLODIPINE BESYLATE 5 MG PO TABS
5.0000 mg | ORAL_TABLET | Freq: Every day | ORAL | 0 refills | Status: DC
  Filled 2022-02-15: qty 90, 90d supply, fill #0

## 2022-02-16 ENCOUNTER — Other Ambulatory Visit (HOSPITAL_COMMUNITY): Payer: Self-pay

## 2022-02-16 MED ORDER — FLUCONAZOLE 150 MG PO TABS
ORAL_TABLET | ORAL | 0 refills | Status: DC
  Filled 2022-02-16: qty 2, 3d supply, fill #0

## 2022-03-20 ENCOUNTER — Emergency Department (HOSPITAL_COMMUNITY): Payer: No Typology Code available for payment source

## 2022-03-20 ENCOUNTER — Encounter (HOSPITAL_COMMUNITY): Payer: Self-pay

## 2022-03-20 ENCOUNTER — Other Ambulatory Visit (HOSPITAL_COMMUNITY): Payer: Self-pay

## 2022-03-20 ENCOUNTER — Other Ambulatory Visit: Payer: Self-pay

## 2022-03-20 ENCOUNTER — Emergency Department (HOSPITAL_COMMUNITY)
Admission: EM | Admit: 2022-03-20 | Discharge: 2022-03-21 | Disposition: A | Discharge status: Home / Self Care | Payer: No Typology Code available for payment source | Attending: Emergency Medicine | Admitting: Emergency Medicine

## 2022-03-20 DIAGNOSIS — I10 Essential (primary) hypertension: Secondary | ICD-10-CM | POA: Diagnosis not present

## 2022-03-20 DIAGNOSIS — E119 Type 2 diabetes mellitus without complications: Secondary | ICD-10-CM | POA: Insufficient documentation

## 2022-03-20 DIAGNOSIS — R079 Chest pain, unspecified: Secondary | ICD-10-CM | POA: Diagnosis present

## 2022-03-20 DIAGNOSIS — N132 Hydronephrosis with renal and ureteral calculous obstruction: Secondary | ICD-10-CM | POA: Insufficient documentation

## 2022-03-20 DIAGNOSIS — R748 Abnormal levels of other serum enzymes: Secondary | ICD-10-CM | POA: Insufficient documentation

## 2022-03-20 DIAGNOSIS — N1330 Unspecified hydronephrosis: Secondary | ICD-10-CM

## 2022-03-20 DIAGNOSIS — Z79899 Other long term (current) drug therapy: Secondary | ICD-10-CM | POA: Diagnosis not present

## 2022-03-20 DIAGNOSIS — E010 Iodine-deficiency related diffuse (endemic) goiter: Secondary | ICD-10-CM | POA: Insufficient documentation

## 2022-03-20 LAB — CBC WITH DIFFERENTIAL/PLATELET
Abs Immature Granulocytes: 0.02 10*3/uL (ref 0.00–0.07)
Basophils Absolute: 0.1 10*3/uL (ref 0.0–0.1)
Basophils Relative: 1 %
Eosinophils Absolute: 0.2 10*3/uL (ref 0.0–0.5)
Eosinophils Relative: 2 %
HCT: 44.9 % (ref 36.0–46.0)
Hemoglobin: 14.7 g/dL (ref 12.0–15.0)
Immature Granulocytes: 0 %
Lymphocytes Relative: 45 %
Lymphs Abs: 4.2 10*3/uL — ABNORMAL HIGH (ref 0.7–4.0)
MCH: 25.7 pg — ABNORMAL LOW (ref 26.0–34.0)
MCHC: 32.7 g/dL (ref 30.0–36.0)
MCV: 78.4 fL — ABNORMAL LOW (ref 80.0–100.0)
Monocytes Absolute: 0.8 10*3/uL (ref 0.1–1.0)
Monocytes Relative: 9 %
Neutro Abs: 3.9 10*3/uL (ref 1.7–7.7)
Neutrophils Relative %: 43 %
Platelets: 264 10*3/uL (ref 150–400)
RBC: 5.73 MIL/uL — ABNORMAL HIGH (ref 3.87–5.11)
RDW: 14.7 % (ref 11.5–15.5)
WBC: 9.1 10*3/uL (ref 4.0–10.5)
nRBC: 0 % (ref 0.0–0.2)

## 2022-03-20 LAB — COMPREHENSIVE METABOLIC PANEL
ALT: 26 U/L (ref 0–44)
AST: 17 U/L (ref 15–41)
Albumin: 3.8 g/dL (ref 3.5–5.0)
Alkaline Phosphatase: 63 U/L (ref 38–126)
Anion gap: 8 (ref 5–15)
BUN: 11 mg/dL (ref 6–20)
CO2: 24 mmol/L (ref 22–32)
Calcium: 9.3 mg/dL (ref 8.9–10.3)
Chloride: 106 mmol/L (ref 98–111)
Creatinine, Ser: 1 mg/dL (ref 0.44–1.00)
GFR, Estimated: >60 mL/min (ref >60)
Glucose, Bld: 145 mg/dL — ABNORMAL HIGH (ref 70–99)
Potassium: 4 mmol/L (ref 3.5–5.1)
Sodium: 138 mmol/L (ref 135–145)
Total Bilirubin: 0.4 mg/dL (ref 0.3–1.2)
Total Protein: 7 g/dL (ref 6.5–8.1)

## 2022-03-20 LAB — LIPASE, BLOOD: Lipase: 53 U/L — ABNORMAL HIGH (ref 11–51)

## 2022-03-20 LAB — TROPONIN I (HIGH SENSITIVITY)
Troponin I (High Sensitivity): 3 ng/L (ref <18)
Troponin I (High Sensitivity): 4 ng/L (ref <18)

## 2022-03-20 MED ORDER — FLUCONAZOLE 150 MG PO TABS
ORAL_TABLET | ORAL | 0 refills | Status: AC
  Filled 2022-03-20: qty 2, 3d supply, fill #0

## 2022-03-20 MED ORDER — LEVOTHYROXINE SODIUM 25 MCG PO TABS
ORAL_TABLET | ORAL | 0 refills | Status: DC
  Filled 2022-03-20: qty 90, 90d supply, fill #0

## 2022-03-20 NOTE — ED Provider Triage Note (Signed)
Emergency Medicine Provider Triage Evaluation Note  Kirsten [name] , a 51 y.o. female  was evaluated in triage.  Pt complains of chest pain.  Experience central stabbing chest pain yesterday but this improved spontaneously.  Recurred again today while she was at work.  Intermittent without specific aggravating or alleviating factor.  No shortness of breath but the pain did take her breath away yesterday when it occurred.  No vomiting, abdominal pain.  Feels like she has had bilateral lower extremity edema that has been intermittent.  No prior cardiac history.  Review of Systems  Positive: Chest pain Negative: Cough  Physical Exam  BP (!) 162/103   Pulse 85   Temp 98.6 F (37 C) (Oral)   Resp 18   SpO2 99%  Gen:   Awake, no distress   Resp:  Normal effort  MSK:   Moves extremities without difficulty  Other:  Lungs clear bilaterally  Medical Decision Making  Medically screening exam initiated at 7:33 PM.  Appropriate orders placed.  Kirsten [name] was informed that the remainder of the evaluation will be completed by another provider, this initial triage assessment does not replace that evaluation, and the importance of remaining in the ED until their evaluation is complete.  Work-up initiated   Dietrich Pates, [place] 03/20/22 1934

## 2022-03-21 ENCOUNTER — Emergency Department (HOSPITAL_COMMUNITY): Payer: No Typology Code available for payment source

## 2022-03-21 LAB — D-DIMER, QUANTITATIVE: D-Dimer, Quant: 0.54 ug/mL-FEU — ABNORMAL HIGH (ref 0.00–0.50)

## 2022-03-21 MED ORDER — IOHEXOL 350 MG/ML SOLN
100.0000 mL | Freq: Once | INTRAVENOUS | Status: AC | PRN
  Administered 2022-03-21: 68 mL via INTRAVENOUS

## 2022-03-21 MED ORDER — METHYLPREDNISOLONE SODIUM SUCC 40 MG IJ SOLR: 40.0000 mg | Freq: Once | INTRAMUSCULAR | Status: DC

## 2022-03-21 MED ORDER — ONDANSETRON HCL 4 MG/2ML IJ SOLN
4.0000 mg | Freq: Once | INTRAMUSCULAR | Status: AC
  Administered 2022-03-21: 4 mg via INTRAVENOUS
  Filled 2022-03-21: qty 2

## 2022-03-21 MED ORDER — PANTOPRAZOLE SODIUM 40 MG PO TBEC
40.0000 mg | DELAYED_RELEASE_TABLET | Freq: Once | ORAL | Status: AC
  Administered 2022-03-21: 40 mg via ORAL
  Filled 2022-03-21: qty 1

## 2022-03-21 MED ORDER — DIPHENHYDRAMINE HCL 25 MG PO CAPS
50.0000 mg | ORAL_CAPSULE | Freq: Once | ORAL | Status: AC
  Administered 2022-03-21: 50 mg via ORAL
  Filled 2022-03-21: qty 2

## 2022-03-21 MED ORDER — DIPHENHYDRAMINE HCL 50 MG/ML IJ SOLN: 50.0000 mg | Freq: Once | INTRAMUSCULAR | Status: AC

## 2022-03-21 MED ORDER — ALUM & MAG HYDROXIDE-SIMETH 200-200-20 MG/5ML PO SUSP
30.0000 mL | Freq: Once | ORAL | Status: AC
  Administered 2022-03-21: 30 mL via ORAL
  Filled 2022-03-21: qty 30

## 2022-03-21 NOTE — ED Notes (Signed)
Patient transported to CT 

## 2022-03-21 NOTE — ED Provider Notes (Signed)
[facility] EMERGENCY DEPARTMENT Provider Note   CSN: [id number] Arrival date & time: 03/20/22  1903     History  Chief Complaint  Patient presents with   Chest Pain    Kirsten [name] is a 51 y.o. female.  The history is provided by the patient.  Chest Pain She has history of hypertension, diabetes, hyperlipidemia who comes in with chest pain which has been constant for the last month.  In the last 2 days, she has had a couple of episodes where there was a sharp, momentary pain which would break take her breath away.  She had been taking Tums which had been giving her temporary relief.  This afternoon, she took 1 dose of famotidine and did not seem to be getting relief, so she took a GI cocktail of Maalox and viscous lidocaine, but it actually made her discomfort worse.  She is a non-smoker but does endorse a family history of premature coronary atherosclerosis.  She denies dyspnea, nausea, diaphoresis.  She denies any change in exercise tolerance.  She has not noted any exertional component to her pain.   Home Medications Prior to Admission medications   Medication Sig Start Date End Date Taking? Authorizing Provider  acetaminophen (TYLENOL) 500 MG tablet Take 1,000 mg by mouth daily as needed for moderate pain.    [provider]  amLODipine (NORVASC) 5 MG tablet Take 5 mg by mouth daily. 05/04/21   [provider]  amLODipine (NORVASC) 5 MG tablet Take 1 tablet by mouth daily. 02/15/22     atorvastatin (LIPITOR) 10 MG tablet Take 10 mg by mouth daily.    [provider]  Cholecalciferol (VITAMIN D) 2000 units CAPS Take 2,000 Units by mouth daily.    [provider]  dapagliflozin propanediol (FARXIGA) 5 MG TABS tablet Take 1 tablet by mouth once daily 10/13/21     fluconazole (DIFLUCAN) 150 MG tablet Take 1 tablet by mouth once, repeat in 3 days if needed for yeast 03/20/22     levothyroxine (SYNTHROID) 25 MCG tablet Take 1 tablet by  mouth every morning on a empty stomach 11/25/21     levothyroxine (SYNTHROID) 25 MCG tablet Take 1 tablet by mouth on an empty stomach in the morning 03/20/22     levothyroxine (SYNTHROID, LEVOTHROID) 25 MCG tablet Take 25 mcg by mouth daily before breakfast.    [provider]  tirzepatide Greggory Keen) 2.5 MG/0.5ML Pen Inject 2.5 mg into the skin once a week 02/15/22     tirzepatide (MOUNJARO) 5 MG/0.5ML Pen Inject 0.5 mL into the skin once a week. 11/24/21     tranexamic acid (LYSTEDA) 650 MG TABS tablet Take 1,300 mg by mouth 3 (three) times daily.    [provider]  valACYclovir (VALTREX) 500 MG tablet Take 500 mg by mouth daily as needed (outbreak).    [provider]      Allergies    Contrast media [iodinated contrast media]    Review of Systems   Review of Systems  Cardiovascular:  Positive for chest pain.  All other systems reviewed and are negative.   Physical Exam Updated Vital Signs BP (!) 161/102 (BP Location: Right Arm)   Pulse 79   Temp 98.5 F (36.9 C) (Oral)   Resp 18   Ht 5\' 3"  (1.6 m)   Wt 106.6 kg   LMP 01/07/2022 (Approximate)   SpO2 94%   BMI 41.63 kg/m  Physical Exam Vitals and nursing note reviewed.  51 year old ffemale, resting comfortably and in no acute distress. Vital signs are significant for elevated blood pressure. Oxygen saturation is 94%, which is normal. Head is normocephalic and atraumatic. PERRLA, EOMI. Oropharynx is clear. Neck is nontender and supple without adenopathy or JVD. Back is nontender and there is no CVA tenderness. Lungs are clear without rales, wheezes, or rhonchi. Chest is nontender. Heart has regular rate and rhythm without murmur. Abdomen is soft, flat, nontender. Extremities have no cyanosis or edema, full range of motion is present. Skin is warm and dry without rash. Neurologic: Mental status is normal, cranial nerves are intact, moves all extremities equally.  ED Results / Procedures /  Treatments   Labs (all labs ordered are listed, but only abnormal results are displayed) Labs Reviewed  COMPREHENSIVE METABOLIC PANEL - Abnormal; Notable for the following components:      Result Value   Glucose, Bld 145 (*)    All other components within normal limits  CBC WITH DIFFERENTIAL/PLATELET - Abnormal; Notable for the following components:   RBC 5.73 (*)    MCV 78.4 (*)    MCH 25.7 (*)    Lymphs Abs 4.2 (*)    All other components within normal limits  LIPASE, BLOOD - Abnormal; Notable for the following components:   Lipase 53 (*)    All other components within normal limits  D-DIMER, QUANTITATIVE - Abnormal; Notable for the following components:   D-Dimer, Quant 0.54 (*)    All other components within normal limits  TROPONIN I (HIGH SENSITIVITY)  TROPONIN I (HIGH SENSITIVITY)    EKG EKG Interpretation  Date/Time:  Monday March 20 2022 19:37:01 EDT Ventricular Rate:  89 PR Interval:  158 QRS Duration: 90 QT Interval:  356 QTC Calculation: 433 R Axis:   -46 Text Interpretation: Normal sinus rhythm Left anterior fascicular block Moderate voltage criteria for LVH, may be normal variant ( R in aVL , Cornell product ) Anterior infarct , age undetermined Abnormal ECG When compared with ECG of 08-May-2017 09:12, No significant change was found Confirmed by Dione Booze ([id number]) on 03/20/2022 11:28:56 PM  Radiology CT Angio Chest PE W and/or Wo Contrast  Result Date: 03/21/2022 CLINICAL DATA:  51 year old female with history of positive D-dimer and chest pain. Evaluate for pulmonary embolism. EXAM: CT ANGIOGRAPHY CHEST WITH CONTRAST TECHNIQUE: Multidetector CT imaging of the chest was performed using the standard protocol during bolus administration of intravenous contrast. Multiplanar CT image reconstructions and MIPs were obtained to evaluate the vascular anatomy. RADIATION DOSE REDUCTION: This exam was performed according to the departmental dose-optimization program which  includes automated exposure control, adjustment of the mA and/or kV according to patient size and/or use of iterative reconstruction technique. CONTRAST:  68mL OMNIPAQUE IOHEXOL 350 MG/ML SOLN COMPARISON:  No priors. FINDINGS: Cardiovascular: No filling defects within the pulmonary arterial tree to suggest pulmonary embolism. Heart size is normal. There is no significant pericardial fluid, thickening or pericardial calcification. No atherosclerotic calcifications in the thoracic aorta or the coronary arteries. Mediastinum/Nodes: No pathologically enlarged mediastinal or hilar lymph nodes. 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 This exerts mass effect upon adjacent structures causing rightward deviation of the trachea and proximal esophagus. No axillary  lymphadenopathy. Lungs/Pleura: No acute consolidative airspace disease. No pleural effusions. No suspicious appearing pulmonary nodules or masses are noted. Upper Abdomen: Left-sided hydronephrosis incompletely imaged. Musculoskeletal: There are no aggressive appearing lytic or blastic lesions noted in the visualized portions of the skeleton. Review of the MIP images confirms the above findings. IMPRESSION: 1. No evidence of pulmonary embolism. 2. Left-sided hydronephrosis. Clinical evaluation for signs and symptoms of urinary tract obstruction is recommended. Further evaluation with noncontrast CT the abdomen and pelvis should be considered if there is clinical concern for urolithiasis. 3. 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 The possibility of malignancy should be considered. Recommend thyroid US (ref: J Am Coll Radiol. 2015 Feb;12(2): 143-50).  Electronically Signed   By: Trudie Reed M.D.   On: 03/21/2022 06:28   DG Chest 2 View  Result Date: 03/20/2022 CLINICAL DATA:  Chest pain. EXAM: CHEST - 2 VIEW COMPARISON:  None Available. FINDINGS: The heart size and mediastinal contours are within normal limits. Both lungs are clear. The visualized skeletal structures are unremarkable. IMPRESSION: No active cardiopulmonary disease. Electronically Signed   By: Darliss Cheney M.D.   On: 03/20/2022 20:03    Procedures Procedures    Medications Ordered in ED Medications - No data to display  ED Course/ Medical Decision Making/ A&P                           Medical Decision Making Amount and/or Complexity of Data Reviewed Labs: ordered. Radiology: ordered.  Risk OTC drugs. Prescription drug management.   Atypical chest pain, etiology unclear.  Differential diagnosis includes coronary artery disease, GERD, musculoskeletal pain, esophageal spasm, pulmonary embolism.  She does have significant risk factors for coronary artery disease, but heart score is only 3 which puts her at low risk for major adverse cardiac events in the next 30 days.  I have independently viewed and interpreted her ECG, and my interpretation is LVH, left anterior fascicular block, no acute ST or T changes and unchanged from prior.  Chest x-ray shows no cardiopulmonary disease.  I have independently viewed the images, and agree with radiologist's interpretation.  I have reviewed and interpreted the laboratory tests and my interpretation is minimal elevation of lipase which is not clinically significant, normal troponin x2.  She does state that her primary care provider has given her prescription for omeprazole which she is scheduled to start tomorrow.  I will give her a therapeutic trial of Maalox without lidocaine to see if she gets some relief.  I have also ordered a D-dimer to rule out pulmonary embolism.  Symptoms are atypical, but not clearly typical of any  significant cause of chest pain.  D-dimer has come back slightly elevated.  I have ordered a CT angiogram of the chest to rule out pulmonary embolism.  Of note, she is listed as having a contrast allergy, but this is not an allergy but an intolerance and that she has nausea.  I have ordered a dose of ondansetron prior to her CT scan.  Also, she did get significant relief with the antiacid without the lidocaine.  I have ordered a dose of pantoprazole.  CT angiogram shows no evidence of pulmonary embolism.  Incidental findings of thyromegaly and left hydronephrosis noted.  I have independently viewed the images, and agree with radiologist interpretation.  On review of old records, it is noted that she had a thyroid ultrasound on April 13, 2020.  I have recommended that she arrange a follow-up thyroid ultrasound.  She has no urinary complaints and no complaints of flank pain, I have referred her to urology for further evaluation of this.  Since getting the dose of pantoprazole, she has had no further chest pain.  It seems likely that her pain is related to GERD.  Patient is advised of the CT findings.  She is to pick up the prescription for omeprazole from her primary care provider and start taking it.  Recommended that she use over-the-counter antacids as needed.  Return if she has any new or concerning symptoms.  Final Clinical Impression(s) / ED Diagnoses Final diagnoses:  Nonspecific chest pain  Hydronephrosis of left kidney  Thyromegaly  Elevated lipase    Rx / DC Orders ED Discharge Orders     None         Dione Booze, MD 03/21/22 [phone]

## 2022-03-21 NOTE — ED Notes (Signed)
Patient verbalizes understanding of d/c instructions. Opportunities for questions and answers were provided. Pt d/c from ED and ambulated to lobby.  

## 2022-03-22 ENCOUNTER — Other Ambulatory Visit (HOSPITAL_COMMUNITY): Payer: Self-pay

## 2022-03-22 ENCOUNTER — Other Ambulatory Visit: Payer: Self-pay | Admitting: Family Medicine

## 2022-03-22 DIAGNOSIS — E042 Nontoxic multinodular goiter: Secondary | ICD-10-CM

## 2022-03-22 MED ORDER — OMEPRAZOLE 40 MG PO CPDR
DELAYED_RELEASE_CAPSULE | ORAL | 1 refills | Status: DC
  Filled 2022-03-22: qty 30, 30d supply, fill #0
  Filled 2022-04-24: qty 30, 30d supply, fill #1

## 2022-03-23 ENCOUNTER — Ambulatory Visit
Admission: RE | Admit: 2022-03-23 | Discharge: 2022-03-23 | Disposition: A | Discharge status: Home / Self Care | Payer: No Typology Code available for payment source | Source: Ambulatory Visit | Attending: Family Medicine | Admitting: Family Medicine

## 2022-03-23 DIAGNOSIS — E042 Nontoxic multinodular goiter: Secondary | ICD-10-CM

## 2022-03-27 ENCOUNTER — Other Ambulatory Visit (HOSPITAL_COMMUNITY): Payer: Self-pay

## 2022-03-27 MED ORDER — FARXIGA 5 MG PO TABS
5.0000 mg | ORAL_TABLET | Freq: Every day | ORAL | 2 refills | Status: DC
  Filled 2022-03-27: qty 30, 30d supply, fill #0
  Filled 2022-04-24: qty 30, 30d supply, fill #1

## 2022-04-01 ENCOUNTER — Other Ambulatory Visit (HOSPITAL_COMMUNITY): Payer: Self-pay

## 2022-04-03 ENCOUNTER — Other Ambulatory Visit (HOSPITAL_COMMUNITY): Payer: Self-pay

## 2022-04-04 ENCOUNTER — Other Ambulatory Visit (HOSPITAL_COMMUNITY): Payer: Self-pay

## 2022-04-04 MED ORDER — ONDANSETRON HCL 4 MG PO TABS
ORAL_TABLET | ORAL | 0 refills | Status: AC
  Filled 2022-04-04: qty 10, 3d supply, fill #0

## 2022-04-05 ENCOUNTER — Other Ambulatory Visit (HOSPITAL_COMMUNITY): Payer: Self-pay

## 2022-04-25 ENCOUNTER — Other Ambulatory Visit (HOSPITAL_COMMUNITY): Payer: Self-pay

## 2022-05-02 ENCOUNTER — Other Ambulatory Visit (HOSPITAL_COMMUNITY): Payer: Self-pay

## 2022-05-02 MED ORDER — LORAZEPAM 0.5 MG PO TABS
ORAL_TABLET | ORAL | 0 refills | Status: AC
  Filled 2022-05-02: qty 30, 10d supply, fill #0

## 2022-05-02 MED ORDER — AMLODIPINE BESYLATE 5 MG PO TABS
ORAL_TABLET | ORAL | 1 refills | Status: AC
  Filled 2022-05-02: qty 90, 90d supply, fill #0

## 2022-05-02 MED ORDER — CELECOXIB 200 MG PO CAPS
ORAL_CAPSULE | ORAL | 0 refills | Status: AC
  Filled 2022-05-02: qty 30, 30d supply, fill #0

## 2022-05-02 MED ORDER — ATORVASTATIN CALCIUM 10 MG PO TABS
ORAL_TABLET | ORAL | 3 refills | Status: DC
  Filled 2022-05-02: qty 90, 90d supply, fill #0

## 2022-05-02 MED ORDER — MOUNJARO 7.5 MG/0.5ML ~~LOC~~ SOAJ
SUBCUTANEOUS | 0 refills | Status: DC
  Filled 2022-05-02: qty 2, 28d supply, fill #0

## 2022-05-02 MED ORDER — FARXIGA 5 MG PO TABS
5.0000 mg | ORAL_TABLET | Freq: Every day | ORAL | 3 refills | Status: DC
  Filled 2022-05-02 – 2022-06-09 (×2): qty 30, 30d supply, fill #0
  Filled 2022-07-05: qty 30, 30d supply, fill #1

## 2022-05-08 ENCOUNTER — Encounter: Payer: Self-pay | Admitting: Hematology and Oncology

## 2022-05-08 ENCOUNTER — Inpatient Hospital Stay
Payer: No Typology Code available for payment source | Attending: Hematology and Oncology | Admitting: Hematology and Oncology

## 2022-05-08 ENCOUNTER — Other Ambulatory Visit: Payer: Self-pay

## 2022-05-08 VITALS — BP 135/96 | HR 89 | Temp 97.8°F | Resp 18 | Wt 232.6 lb

## 2022-05-08 DIAGNOSIS — Z8042 Family history of malignant neoplasm of prostate: Secondary | ICD-10-CM | POA: Insufficient documentation

## 2022-05-08 DIAGNOSIS — Z1501 Genetic susceptibility to malignant neoplasm of breast: Secondary | ICD-10-CM | POA: Insufficient documentation

## 2022-05-08 DIAGNOSIS — Z803 Family history of malignant neoplasm of breast: Secondary | ICD-10-CM | POA: Diagnosis not present

## 2022-05-08 DIAGNOSIS — Z9189 Other specified personal risk factors, not elsewhere classified: Secondary | ICD-10-CM

## 2022-05-08 NOTE — Progress Notes (Signed)
[facility] CONSULT NOTE  Patient Care Team: [name], MD as PCP - General (Family Medicine)  CHIEF COMPLAINTS/PURPOSE OF CONSULTATION:  Increased risk of breast cancer  ASSESSMENT & PLAN:   This is a very pleasant 51 year old female patient who was referred to high risk breast clinic given her lifetime risk of breast cancer greater than 20% declined tamoxifen endocrine prevention who is here for follow-up. Since last visit, she had MRI breast in February 2023, no evidence of breast malignancy.  She also had a mammogram in July which was unremarkable. Today's exam is unremarkable.  She will alternate MRI with mammograms. RTC in 6 months with [place] for follow up FU with PCP for other health issues.  HISTORY OF PRESENTING ILLNESS:   Kirsten [name] 51 y.o. female is here because of Increased life time risk of breast cancer per TC model   Interval history  Since last visit, had episodes of chest pain, was evaluated for heart disease, PE. No evidence of either. She is currently on prilosec and has been doing slightly better. She is also undergoing gynecology evaluation for some uterine findings. Rest of the pertinent 10 point ROS reviewed and negative.  MEDICAL HISTORY:  Past Medical History:  Diagnosis Date   Diabetes mellitus without complication (HCC)    GERD (gastroesophageal reflux disease)    Hypertension    Thyroid disease     SURGICAL HISTORY: Past Surgical History:  Procedure Laterality Date   DILITATION & CURRETTAGE/HYSTROSCOPY WITH HYDROTHERMAL ABLATION N/A 05/16/2017   Procedure: DILATATION & CURETTAGE/HYSTEROSCOPY WITH HYDROTHERMAL ABLATION;  Surgeon: Geryl Rankins, MD;  Location: [facility];  Service: Gynecology;  Laterality: N/A;   TUBAL LIGATION  1997    SOCIAL HISTORY: Social History   Socioeconomic History   Marital status: Married    Spouse name: Not on file   Number of children: Not on file   Years of education: Not on file   Highest  education level: Not on file  Occupational History   Occupation: CMA  Tobacco Use   Smoking status: Never   Smokeless tobacco: Never  Vaping Use   Vaping Use: Never used  Substance and Sexual Activity   Alcohol use: Yes    Alcohol/week: 0.0 standard drinks of alcohol    Comment: rare   Drug use: No   Sexual activity: Not on file  Other Topics Concern   Not on file  Social History Narrative   Not on file   Social Determinants of Health   Financial Resource Strain: Not on file  Food Insecurity: Not on file  Transportation Needs: Not on file  Physical Activity: Not on file  Stress: Not on file  Social Connections: Not on file  Intimate Partner Violence: Not on file    FAMILY HISTORY: No family history on file.  ALLERGIES:  is allergic to contrast media [iodinated contrast media].  MEDICATIONS:  Current Outpatient Medications  Medication Sig Dispense Refill   acetaminophen (TYLENOL) 500 MG tablet Take 1,000 mg by mouth daily as needed for moderate pain.     amLODipine (NORVASC) 5 MG tablet Take 1 tablet by mouth once daily 90 tablet 1   atorvastatin (LIPITOR) 10 MG tablet Take 1 tablet by mouth once daily 90 tablet 3   calcium carbonate (TUMS EX) 750 MG chewable tablet Chew 2 tablets by mouth at bedtime.     celecoxib (CELEBREX) 200 MG capsule Take 1 capsule by mouth once a day with food 30 capsule 0  cetirizine (ZYRTEC) 10 MG tablet Take 10 mg by mouth daily.     Cholecalciferol (VITAMIN D) 2000 units CAPS Take 2,000 Units by mouth daily.     dapagliflozin propanediol (FARXIGA) 5 MG TABS tablet Take 1 tablet by mouth once daily 30 tablet 3   Famotidine (PEPCID AC PO) Take 1 tablet by mouth daily as needed (heartburn).     fluconazole (DIFLUCAN) 150 MG tablet Take 1 tablet by mouth once, repeat in 3 days if needed for yeast (Patient taking differently: Take 150 mg by mouth See admin instructions. Take 1 tablet now, repeat dose in 3 days if needed) 2 tablet 0   fluticasone  (FLONASE) 50 MCG/ACT nasal spray Place 2 sprays into both nostrils daily.     levothyroxine (SYNTHROID) 25 MCG tablet Take 1 tablet by mouth every morning on a empty stomach (Patient taking differently: Take 25 mcg by mouth daily before breakfast.) 90 tablet 1   LORazepam (ATIVAN) 0.5 MG tablet Take 1/2 to 1 tablet by mouth 3 times per day as needed for anxiety 30 tablet 0   magnesium oxide (MAG-OX) 400 (240 Mg) MG tablet Take 400 mg by mouth at bedtime.     omeprazole (PRILOSEC) 40 MG capsule Take 1 capsule by mouth 30 minutes before morning meal 30 capsule 1   ondansetron (ZOFRAN) 4 MG tablet Take 1 tablet by mouth every 4 hours as needed 10 tablet 0   tirzepatide (MOUNJARO) 7.5 MG/0.5ML Pen Inject 7.5 mg under the skin once a week 2 mL 0   valACYclovir (VALTREX) 500 MG tablet Take 500 mg by mouth daily as needed (outbreak).     No current facility-administered medications for this visit.     PHYSICAL EXAMINATION: ECOG PERFORMANCE STATUS: 0 - Asymptomatic  Vitals:   05/08/22 0847  BP: (!) 135/96  Pulse: 89  Resp: 18  Temp: 97.8 F (36.6 C)  SpO2: 96%    Filed Weights   05/08/22 0847  Weight: 232 lb 9 oz (105.5 kg)   Physical Exam Constitutional:      Appearance: Normal appearance.  Cardiovascular:     Rate and Rhythm: Normal rate and regular rhythm.  Chest:     Comments: Bilateral breasts inspected.  Normal inspection.  On palpation there is no definitive masses or skin changes or nipple changes.  No palpable lymphadenopathy Musculoskeletal:     Cervical back: Normal range of motion and neck supple. No rigidity.  Lymphadenopathy:     Cervical: No cervical adenopathy.  Neurological:     Mental Status: She is alert.     LABORATORY DATA:  I have reviewed the data as listed Lab Results  Component Value Date   WBC 9.1 03/20/2022   HGB 14.7 03/20/2022   HCT 44.9 03/20/2022   MCV 78.4 (L) 03/20/2022   PLT 264 03/20/2022     Chemistry      Component Value Date/Time    NA 138 03/20/2022 1941   K 4.0 03/20/2022 1941   CL 106 03/20/2022 1941   CO2 24 03/20/2022 1941   BUN 11 03/20/2022 1941   CREATININE 1.00 03/20/2022 1941      Component Value Date/Time   CALCIUM 9.3 03/20/2022 1941   ALKPHOS 63 03/20/2022 1941   AST 17 03/20/2022 1941   ALT 26 03/20/2022 1941   BILITOT 0.4 03/20/2022 1941     RADIOGRAPHIC STUDIES: I have personally reviewed the radiological images as listed and agreed with the findings in the report. No results found.  All questions were answered. The patient knows to call the clinic with any problems, questions or concerns. I spent 30 minutes in the care of this patient including H and P, review of records, counseling and coordination of care.     Benay Pike, MD 05/08/2022 8:54 AM

## 2022-05-26 ENCOUNTER — Other Ambulatory Visit (HOSPITAL_COMMUNITY): Payer: Self-pay

## 2022-05-26 MED ORDER — MOUNJARO 7.5 MG/0.5ML ~~LOC~~ SOAJ
SUBCUTANEOUS | 0 refills | Status: DC
  Filled 2022-05-26: qty 2, 28d supply, fill #0

## 2022-06-01 ENCOUNTER — Other Ambulatory Visit (HOSPITAL_COMMUNITY): Payer: Self-pay

## 2022-06-09 ENCOUNTER — Other Ambulatory Visit (HOSPITAL_COMMUNITY): Payer: Self-pay

## 2022-06-21 ENCOUNTER — Other Ambulatory Visit (HOSPITAL_COMMUNITY): Payer: Self-pay

## 2022-06-21 MED ORDER — FREESTYLE LIBRE 3 SENSOR MISC
11 refills | Status: AC
  Filled 2022-06-21 – 2022-08-01 (×2): qty 2, 28d supply, fill #0
  Filled 2023-06-07: qty 2, 28d supply, fill #1

## 2022-06-23 ENCOUNTER — Other Ambulatory Visit (HOSPITAL_COMMUNITY): Payer: Self-pay

## 2022-06-23 MED ORDER — LEVOTHYROXINE SODIUM 25 MCG PO TABS
ORAL_TABLET | ORAL | 1 refills | Status: DC
  Filled 2022-06-23 – 2022-06-27 (×2): qty 90, 90d supply, fill #0
  Filled 2022-09-29: qty 90, 90d supply, fill #1

## 2022-06-27 ENCOUNTER — Other Ambulatory Visit (HOSPITAL_COMMUNITY): Payer: Self-pay

## 2022-07-06 ENCOUNTER — Other Ambulatory Visit (HOSPITAL_COMMUNITY): Payer: Self-pay

## 2022-07-06 MED ORDER — OMEPRAZOLE 40 MG PO CPDR
40.0000 mg | DELAYED_RELEASE_CAPSULE | Freq: Every day | ORAL | 2 refills | Status: DC
  Filled 2022-07-06: qty 30, 30d supply, fill #0

## 2022-07-24 ENCOUNTER — Encounter: Payer: Self-pay | Admitting: Adult Health

## 2022-07-31 ENCOUNTER — Other Ambulatory Visit (HOSPITAL_COMMUNITY): Payer: Self-pay

## 2022-07-31 MED ORDER — FARXIGA 5 MG PO TABS
5.0000 mg | ORAL_TABLET | Freq: Every day | ORAL | 3 refills | Status: DC
  Filled 2022-07-31: qty 30, 30d supply, fill #0
  Filled 2022-09-13: qty 30, 30d supply, fill #1
  Filled 2022-10-27: qty 30, 30d supply, fill #2
  Filled 2022-12-04 – 2023-01-15 (×3): qty 30, 30d supply, fill #3

## 2022-07-31 MED ORDER — ATORVASTATIN CALCIUM 10 MG PO TABS
10.0000 mg | ORAL_TABLET | Freq: Every day | ORAL | 3 refills | Status: AC
  Filled 2022-07-31: qty 90, 90d supply, fill #0

## 2022-07-31 MED ORDER — OMEPRAZOLE 40 MG PO CPDR
40.0000 mg | DELAYED_RELEASE_CAPSULE | Freq: Every day | ORAL | 1 refills | Status: AC
  Filled 2022-07-31: qty 90, 90d supply, fill #0
  Filled 2023-02-15: qty 90, 90d supply, fill #1

## 2022-07-31 MED ORDER — AMLODIPINE BESYLATE 5 MG PO TABS
5.0000 mg | ORAL_TABLET | Freq: Every day | ORAL | 1 refills | Status: DC
  Filled 2022-07-31: qty 90, 90d supply, fill #0

## 2022-07-31 MED ORDER — MOUNJARO 10 MG/0.5ML ~~LOC~~ SOAJ
10.0000 mg | SUBCUTANEOUS | 0 refills | Status: DC
  Filled 2022-07-31: qty 2, 28d supply, fill #0

## 2022-08-01 ENCOUNTER — Other Ambulatory Visit (HOSPITAL_COMMUNITY): Payer: Self-pay

## 2022-08-02 ENCOUNTER — Other Ambulatory Visit: Payer: Self-pay

## 2022-08-02 DIAGNOSIS — Z9189 Other specified personal risk factors, not elsewhere classified: Secondary | ICD-10-CM

## 2022-08-02 NOTE — Progress Notes (Signed)
Pt requested MRI order be faxed to [facility] at [phone]. Fax confirmation received.

## 2022-08-04 ENCOUNTER — Other Ambulatory Visit (HOSPITAL_COMMUNITY): Payer: Self-pay

## 2022-08-04 MED ORDER — VEOZAH 45 MG PO TABS
45.0000 mg | ORAL_TABLET | Freq: Every day | ORAL | 3 refills | Status: AC
  Filled 2022-08-04: qty 30, 30d supply, fill #0
  Filled 2022-09-13: qty 30, 30d supply, fill #1
  Filled 2022-11-22: qty 30, 30d supply, fill #2
  Filled 2023-01-02 – 2023-01-22 (×2): qty 30, 30d supply, fill #3

## 2022-08-07 ENCOUNTER — Other Ambulatory Visit (HOSPITAL_COMMUNITY): Payer: Self-pay

## 2022-08-09 ENCOUNTER — Other Ambulatory Visit (HOSPITAL_COMMUNITY): Payer: Self-pay

## 2022-09-06 ENCOUNTER — Encounter: Payer: Self-pay | Admitting: Hematology and Oncology

## 2022-09-11 ENCOUNTER — Other Ambulatory Visit (HOSPITAL_COMMUNITY): Payer: Self-pay

## 2022-09-11 MED ORDER — MOUNJARO 10 MG/0.5ML ~~LOC~~ SOAJ
10.0000 mg | SUBCUTANEOUS | 0 refills | Status: DC
  Filled 2022-09-11: qty 2, 28d supply, fill #0

## 2022-09-13 ENCOUNTER — Other Ambulatory Visit (HOSPITAL_COMMUNITY): Payer: Self-pay

## 2022-09-13 MED ORDER — FLUCONAZOLE 150 MG PO TABS
ORAL_TABLET | ORAL | 1 refills | Status: DC
  Filled 2022-09-13: qty 10, 20d supply, fill #0

## 2022-09-14 ENCOUNTER — Other Ambulatory Visit (HOSPITAL_COMMUNITY): Payer: Self-pay

## 2022-10-07 ENCOUNTER — Other Ambulatory Visit (HOSPITAL_COMMUNITY): Payer: Self-pay

## 2022-10-12 ENCOUNTER — Other Ambulatory Visit: Payer: Self-pay

## 2022-10-12 ENCOUNTER — Other Ambulatory Visit (HOSPITAL_COMMUNITY): Payer: Self-pay

## 2022-10-12 MED ORDER — BENZONATATE 100 MG PO CAPS
100.0000 mg | ORAL_CAPSULE | Freq: Three times a day (TID) | ORAL | 0 refills | Status: DC
  Filled 2022-10-12: qty 30, 10d supply, fill #0

## 2022-10-12 MED ORDER — PAXLOVID (300/100) 20 X 150 MG & 10 X 100MG PO TBPK
3.0000 | ORAL_TABLET | Freq: Two times a day (BID) | ORAL | 0 refills | Status: DC
  Filled 2022-10-12: qty 30, 5d supply, fill #0

## 2022-10-14 ENCOUNTER — Other Ambulatory Visit (HOSPITAL_COMMUNITY): Payer: Self-pay

## 2022-10-14 MED ORDER — HYDROCOD POLI-CHLORPHE POLI ER 10-8 MG/5ML PO SUER
ORAL | 0 refills | Status: DC
  Filled 2022-10-14: qty 70, 7d supply, fill #0

## 2022-10-16 ENCOUNTER — Other Ambulatory Visit (HOSPITAL_COMMUNITY): Payer: Self-pay

## 2022-10-16 MED ORDER — MOUNJARO 10 MG/0.5ML ~~LOC~~ SOAJ
10.0000 mg | SUBCUTANEOUS | 0 refills | Status: DC
  Filled 2022-10-16 – 2022-12-07 (×2): qty 2, 28d supply, fill #0

## 2022-10-18 ENCOUNTER — Telehealth: Payer: Self-pay | Admitting: Adult Health

## 2022-10-18 NOTE — Telephone Encounter (Signed)
Rescheduled appointment per provider template. Left voicemail. 

## 2022-10-28 ENCOUNTER — Other Ambulatory Visit (HOSPITAL_COMMUNITY): Payer: Self-pay

## 2022-10-31 ENCOUNTER — Other Ambulatory Visit (HOSPITAL_COMMUNITY): Payer: Self-pay

## 2022-11-08 ENCOUNTER — Inpatient Hospital Stay: Payer: No Typology Code available for payment source | Admitting: Adult Health

## 2022-11-29 ENCOUNTER — Other Ambulatory Visit (HOSPITAL_COMMUNITY): Payer: Self-pay

## 2022-12-06 ENCOUNTER — Other Ambulatory Visit (HOSPITAL_COMMUNITY): Payer: Self-pay

## 2022-12-07 ENCOUNTER — Other Ambulatory Visit (HOSPITAL_COMMUNITY): Payer: Self-pay

## 2022-12-18 ENCOUNTER — Encounter: Payer: Self-pay | Admitting: Hematology and Oncology

## 2022-12-21 ENCOUNTER — Other Ambulatory Visit: Payer: Self-pay

## 2022-12-21 NOTE — Progress Notes (Signed)
Pt sent MyChart message stating that the allergy to contrast in her chart is preventing her from scheduling MRI. Pt reports contrast only causes nausea and she is able to tolerate this. Allergy removed and Pt notified.

## 2022-12-26 ENCOUNTER — Other Ambulatory Visit (HOSPITAL_COMMUNITY): Payer: Self-pay

## 2022-12-29 ENCOUNTER — Telehealth: Payer: Self-pay | Admitting: Adult Health

## 2022-12-29 ENCOUNTER — Encounter: Payer: Self-pay | Admitting: Hematology and Oncology

## 2023-01-01 ENCOUNTER — Other Ambulatory Visit (HOSPITAL_COMMUNITY): Payer: Self-pay

## 2023-01-02 ENCOUNTER — Other Ambulatory Visit (HOSPITAL_COMMUNITY): Payer: Self-pay

## 2023-01-03 ENCOUNTER — Other Ambulatory Visit: Payer: Self-pay

## 2023-01-04 ENCOUNTER — Other Ambulatory Visit (HOSPITAL_COMMUNITY): Payer: Self-pay

## 2023-01-04 MED ORDER — MOUNJARO 10 MG/0.5ML ~~LOC~~ SOAJ
10.0000 mg | SUBCUTANEOUS | 0 refills | Status: DC
  Filled 2023-01-04 – 2023-02-01 (×2): qty 2, 28d supply, fill #0

## 2023-01-04 MED ORDER — ERGOCALCIFEROL 1.25 MG (50000 UT) PO CAPS
50000.0000 [IU] | ORAL_CAPSULE | ORAL | 1 refills | Status: AC
  Filled 2023-01-04: qty 13, 90d supply, fill #0
  Filled 2023-01-15: qty 12, 84d supply, fill #0

## 2023-01-15 ENCOUNTER — Other Ambulatory Visit (HOSPITAL_COMMUNITY): Payer: Self-pay

## 2023-01-18 ENCOUNTER — Telehealth: Payer: Self-pay

## 2023-01-18 ENCOUNTER — Other Ambulatory Visit (HOSPITAL_COMMUNITY): Payer: Self-pay

## 2023-01-18 ENCOUNTER — Inpatient Hospital Stay: Payer: No Typology Code available for payment source | Attending: Adult Health | Admitting: Adult Health

## 2023-01-18 ENCOUNTER — Encounter: Payer: Self-pay | Admitting: Adult Health

## 2023-01-18 ENCOUNTER — Other Ambulatory Visit: Payer: Self-pay

## 2023-01-18 VITALS — BP 122/78 | HR 90 | Temp 97.9°F | Resp 18 | Ht 63.0 in | Wt 228.1 lb

## 2023-01-18 DIAGNOSIS — Z803 Family history of malignant neoplasm of breast: Secondary | ICD-10-CM | POA: Diagnosis not present

## 2023-01-18 DIAGNOSIS — Z9189 Other specified personal risk factors, not elsewhere classified: Secondary | ICD-10-CM | POA: Insufficient documentation

## 2023-01-18 DIAGNOSIS — Z8052 Family history of malignant neoplasm of bladder: Secondary | ICD-10-CM | POA: Insufficient documentation

## 2023-01-18 DIAGNOSIS — E785 Hyperlipidemia, unspecified: Secondary | ICD-10-CM | POA: Insufficient documentation

## 2023-01-18 DIAGNOSIS — Z8042 Family history of malignant neoplasm of prostate: Secondary | ICD-10-CM | POA: Insufficient documentation

## 2023-01-18 DIAGNOSIS — I10 Essential (primary) hypertension: Secondary | ICD-10-CM | POA: Insufficient documentation

## 2023-01-18 DIAGNOSIS — E119 Type 2 diabetes mellitus without complications: Secondary | ICD-10-CM | POA: Insufficient documentation

## 2023-01-18 MED ORDER — VALACYCLOVIR HCL 1 G PO TABS
1000.0000 mg | ORAL_TABLET | Freq: Three times a day (TID) | ORAL | 0 refills | Status: DC
  Filled 2023-01-18: qty 21, 7d supply, fill #0

## 2023-01-18 NOTE — Telephone Encounter (Signed)
Called [facility] to obtain MRI breast report. Provided our fax number. They state it is faxed over.

## 2023-01-18 NOTE — Assessment & Plan Note (Signed)
Kirsten [name] is a 53 year old woman who is at increased risk for breast cancer with a Tyrer-Cuzick score of 29%.  She will continue with intensified screening with annual mammograms alternating with breast MRI.  We are awaiting breast MRI results but so far she has no signs of breast cancer.  Her sister has a personal history of breast cancer and asked that she see if she has undergone genetic testing.  She will let me know.  I recommended healthy diet and exercise.  We discussed her BMI and that women make estrogen not just from the ovaries but also from fat cells and adrenal gland.  A reduction in her BMI can help reduce her risk of breast cancer.  Kirsten [name] will return in 6 months for follow-up.

## 2023-01-18 NOTE — Progress Notes (Signed)
[facility] Cancer Follow up:    Kirsten Montana, MD [phone] [address] [place] [address]   DIAGNOSIS: Increased risk of breast cancer  SUMMARY OF HIGH RISK HISTORY: Referred to high risk clinic in 04/2021 for Tyrer Cusick risk score of 29% Risk reduction with chemoprevention declined Intensified screening with mammogram alternating with MRI.    CURRENT THERAPY: observation  INTERVAL HISTORY: Kirsten [name] 52 y.o. female returns for follow-up of her increased risk for breast cancer based on Tyrer-Cuzick score of 29%.  She undergoes annual mammogram most recently in 03/2022 negative for malignancy and breast density category B.  Her most recent MRI was 12/2022 at [facility] and we do not have those results yet.  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  She lives in [place] with her husband and her 46 year old son.  Her hobbies include going to church and being active in the community organization.  She exercises inconsistently once a week at most.  Patient Active Problem List   Diagnosis Date Noted   At increased risk of breast cancer 01/18/2023   Controlled type 2 diabetes mellitus without complication 01/18/2023   Essential (primary) hypertension 01/18/2023   Dyslipidemia 01/18/2023   Upper airway cough syndrome 10/23/2015   Severe obesity (BMI >= 40) 10/23/2015   Thyroid nodule 04/23/2014    has no active allergies.  MEDICAL HISTORY: Past Medical History:  Diagnosis Date   Diabetes mellitus without complication    GERD (gastroesophageal reflux disease)    Hypertension    Thyroid disease     SURGICAL HISTORY: Past Surgical History:  Procedure Laterality Date   DILITATION & CURRETTAGE/HYSTROSCOPY WITH HYDROTHERMAL ABLATION N/A 05/16/2017   Procedure: DILATATION & CURETTAGE/HYSTEROSCOPY WITH HYDROTHERMAL ABLATION;  Surgeon: Geryl Rankins, MD;  Location: [facility];  Service: Gynecology;  Laterality: N/A;    TUBAL LIGATION  1997    SOCIAL HISTORY: Married, lives with her husband and son in [place], [place].  Works at [employer].    GYN: menarche at age 48, OCP x 10 years, G45, P3   FAMILY HISTORY: Family History  Problem Relation Age of Onset   Prostate cancer Father    Bladder Cancer Father    Breast cancer Sister 14       had genetic testing, unknown if + or -   Pancreatic cancer Neg Hx    Colon cancer Neg Hx    Skin cancer Neg Hx     Review of Systems  Constitutional:  Negative for appetite change, chills, fatigue, fever and unexpected weight change.  HENT:   Negative for hearing loss, lump/mass and trouble swallowing.   Eyes:  Negative for eye problems and icterus.  Respiratory:  Negative for chest tightness, cough and shortness of breath.   Cardiovascular:  Negative for chest pain, leg swelling and palpitations.  Gastrointestinal:  Negative for abdominal distention, abdominal pain, constipation, diarrhea, nausea and vomiting.  Endocrine: Negative for hot flashes.  Genitourinary:  Negative for difficulty urinating.   Musculoskeletal:  Negative for arthralgias.  Skin:  Negative for itching and rash.  Neurological:  Negative for dizziness, extremity weakness, headaches and numbness.  Hematological:  Negative for adenopathy. Does not bruise/bleed easily.  Psychiatric/Behavioral:  Negative for depression. The patient is not nervous/anxious.       PHYSICAL EXAMINATION   Onc Performance Status - 01/18/23 1238       ECOG Perf Status   ECOG Perf Status Fully active, able to  carry on all pre-disease performance without restriction      KPS SCALE   KPS % SCORE Normal, no compliants, no evidence of disease             Vitals:   01/18/23 1226  BP: 122/78  Pulse: 90  Resp: 18  Temp: 97.9 F (36.6 C)  SpO2: 100%    Physical Exam Constitutional:      General: She is not in acute distress.    Appearance: Normal appearance. She is not toxic-appearing.  HENT:      Head: Normocephalic and atraumatic.  Eyes:     General: No scleral icterus. Cardiovascular:     Rate and Rhythm: Normal rate and regular rhythm.     Pulses: Normal pulses.     Heart sounds: Normal heart sounds.  Pulmonary:     Effort: Pulmonary effort is normal.     Breath sounds: Normal breath sounds.  Chest:     Comments: Bilateral breast without nodules masses or any signs of breast cancer. Abdominal:     General: Abdomen is flat. Bowel sounds are normal. There is no distension.     Palpations: Abdomen is soft.     Tenderness: There is no abdominal tenderness.  Musculoskeletal:        General: No swelling.     Cervical back: Neck supple.  Lymphadenopathy:     Cervical: No cervical adenopathy.  Skin:    General: Skin is warm and dry.     Findings: No rash.  Neurological:     General: No focal deficit present.     Mental Status: She is alert.  Psychiatric:        Mood and Affect: Mood normal.        Behavior: Behavior normal.     LABORATORY DATA:  None for this visit  ASSESSMENT and THERAPY PLAN:   At increased risk of breast cancer Kirsten [name] is a 52 year old woman who is at increased risk for breast cancer with a Tyrer-Cuzick score of 29%.  She will continue with intensified screening with annual mammograms alternating with breast MRI.  We are awaiting breast MRI results but so far she has no signs of breast cancer.  Her sister has a personal history of breast cancer and asked that she see if she has undergone genetic testing.  She will let me know.  I recommended healthy diet and exercise.  We discussed her BMI and that women make estrogen not just from the ovaries but also from fat cells and adrenal gland.  A reduction in her BMI can help reduce her risk of breast cancer.  Kirsten [name] will return in 6 months for follow-up.  All questions were answered. The patient knows to call the clinic with any problems, questions or concerns. We can certainly see the patient much sooner  if necessary.  Total encounter time:20 minutes*in face-to-face visit time, chart review, lab review, care coordination, order entry, and documentation of the encounter time.    Lillard Anes, NP 01/18/23 1:25 PM Medical Oncology and Hematology [facility] [address] [place], [place] [address] Tel. [phone]    Fax. [phone]  *Total Encounter Time as defined by the Centers for Medicare and Medicaid Services includes, in addition to the face-to-face time of a patient visit (documented in the note above) non-face-to-face time: obtaining and reviewing outside history, ordering and reviewing medications, tests or procedures, care coordination (communications with other health care professionals or caregivers) and documentation in the medical record.

## 2023-01-19 ENCOUNTER — Other Ambulatory Visit (HOSPITAL_COMMUNITY): Payer: Self-pay

## 2023-01-19 ENCOUNTER — Encounter: Payer: Self-pay | Admitting: Adult Health

## 2023-01-19 ENCOUNTER — Telehealth: Payer: Self-pay | Admitting: Adult Health

## 2023-01-19 MED ORDER — MEDROXYPROGESTERONE ACETATE 2.5 MG PO TABS
ORAL_TABLET | ORAL | 0 refills | Status: AC
  Filled 2023-01-19: qty 10, 10d supply, fill #0

## 2023-01-19 NOTE — Telephone Encounter (Signed)
Scheduled appointment per 4/25 los. Patient is aware of the made appointment. 

## 2023-01-22 ENCOUNTER — Encounter: Payer: Self-pay | Admitting: Hematology and Oncology

## 2023-01-23 ENCOUNTER — Other Ambulatory Visit (HOSPITAL_COMMUNITY): Payer: Self-pay

## 2023-01-23 MED ORDER — LEVOTHYROXINE SODIUM 25 MCG PO TABS
25.0000 ug | ORAL_TABLET | Freq: Every morning | ORAL | 3 refills | Status: DC
  Filled 2023-01-23: qty 90, 90d supply, fill #0
  Filled 2023-05-11 – 2023-05-12 (×2): qty 90, 90d supply, fill #1
  Filled 2023-08-17: qty 90, 90d supply, fill #2
  Filled 2023-11-16: qty 90, 90d supply, fill #3

## 2023-02-01 ENCOUNTER — Other Ambulatory Visit (HOSPITAL_COMMUNITY): Payer: Self-pay

## 2023-02-02 ENCOUNTER — Other Ambulatory Visit (HOSPITAL_COMMUNITY): Payer: Self-pay

## 2023-02-15 ENCOUNTER — Other Ambulatory Visit (HOSPITAL_COMMUNITY): Payer: Self-pay

## 2023-02-15 MED ORDER — DAPAGLIFLOZIN PROPANEDIOL 5 MG PO TABS
5.0000 mg | ORAL_TABLET | Freq: Every day | ORAL | 1 refills | Status: DC
  Filled 2023-02-15 (×2): qty 30, 30d supply, fill #0
  Filled 2023-05-03: qty 30, 30d supply, fill #1

## 2023-03-01 ENCOUNTER — Other Ambulatory Visit (HOSPITAL_COMMUNITY): Payer: Self-pay

## 2023-03-02 ENCOUNTER — Other Ambulatory Visit (HOSPITAL_COMMUNITY): Payer: Self-pay

## 2023-03-02 MED ORDER — VEOZAH 45 MG PO TABS
ORAL_TABLET | ORAL | 0 refills | Status: AC
  Filled 2023-03-02: qty 30, 30d supply, fill #0

## 2023-03-02 MED ORDER — MOUNJARO 10 MG/0.5ML ~~LOC~~ SOAJ
10.0000 mg | SUBCUTANEOUS | 3 refills | Status: AC
  Filled 2023-07-11 – 2023-08-02 (×2): qty 2, 28d supply, fill #0

## 2023-03-02 MED ORDER — CELECOXIB 200 MG PO CAPS
200.0000 mg | ORAL_CAPSULE | Freq: Every day | ORAL | 0 refills | Status: AC
  Filled 2023-03-02: qty 30, 30d supply, fill #0

## 2023-03-02 MED ORDER — MOUNJARO 12.5 MG/0.5ML ~~LOC~~ SOAJ
12.5000 mg | SUBCUTANEOUS | 3 refills | Status: AC
  Filled 2023-03-02 (×2): qty 2, 28d supply, fill #0
  Filled 2023-03-28: qty 2, 28d supply, fill #1
  Filled 2023-05-11 – 2023-05-24 (×3): qty 2, 28d supply, fill #2

## 2023-03-02 MED ORDER — MOUNJARO 10 MG/0.5ML ~~LOC~~ SOAJ
10.0000 mg | SUBCUTANEOUS | 0 refills | Status: AC
  Filled 2023-03-02 – 2023-05-26 (×2): qty 2, 28d supply, fill #0

## 2023-03-03 ENCOUNTER — Other Ambulatory Visit (HOSPITAL_COMMUNITY): Payer: Self-pay

## 2023-03-05 ENCOUNTER — Other Ambulatory Visit (HOSPITAL_COMMUNITY): Payer: Self-pay

## 2023-03-07 ENCOUNTER — Other Ambulatory Visit (HOSPITAL_COMMUNITY): Payer: Self-pay

## 2023-03-12 ENCOUNTER — Other Ambulatory Visit (HOSPITAL_COMMUNITY): Payer: Self-pay

## 2023-03-15 ENCOUNTER — Other Ambulatory Visit (HOSPITAL_COMMUNITY): Payer: Self-pay

## 2023-03-28 ENCOUNTER — Other Ambulatory Visit (HOSPITAL_COMMUNITY): Payer: Self-pay

## 2023-05-04 ENCOUNTER — Other Ambulatory Visit (HOSPITAL_COMMUNITY): Payer: Self-pay

## 2023-05-07 ENCOUNTER — Other Ambulatory Visit (HOSPITAL_COMMUNITY): Payer: Self-pay

## 2023-05-11 ENCOUNTER — Other Ambulatory Visit: Payer: Self-pay

## 2023-05-12 ENCOUNTER — Other Ambulatory Visit (HOSPITAL_COMMUNITY): Payer: Self-pay

## 2023-05-12 ENCOUNTER — Other Ambulatory Visit: Payer: Self-pay

## 2023-05-14 ENCOUNTER — Other Ambulatory Visit (HOSPITAL_COMMUNITY): Payer: Self-pay

## 2023-05-14 ENCOUNTER — Other Ambulatory Visit: Payer: Self-pay

## 2023-05-18 ENCOUNTER — Other Ambulatory Visit (HOSPITAL_COMMUNITY): Payer: Self-pay

## 2023-05-24 ENCOUNTER — Other Ambulatory Visit: Payer: Self-pay

## 2023-05-24 ENCOUNTER — Other Ambulatory Visit (HOSPITAL_COMMUNITY): Payer: Self-pay

## 2023-05-25 ENCOUNTER — Other Ambulatory Visit (HOSPITAL_COMMUNITY): Payer: Self-pay

## 2023-05-25 MED ORDER — FLUCONAZOLE 150 MG PO TABS
ORAL_TABLET | ORAL | 1 refills | Status: AC
  Filled 2023-05-25: qty 10, 30d supply, fill #0

## 2023-05-26 ENCOUNTER — Other Ambulatory Visit (HOSPITAL_COMMUNITY): Payer: Self-pay

## 2023-06-07 ENCOUNTER — Other Ambulatory Visit: Payer: Self-pay

## 2023-06-07 ENCOUNTER — Other Ambulatory Visit (HOSPITAL_COMMUNITY): Payer: Self-pay

## 2023-06-09 ENCOUNTER — Other Ambulatory Visit (HOSPITAL_COMMUNITY): Payer: Self-pay

## 2023-06-09 MED ORDER — PAXLOVID (300/100) 20 X 150 MG & 10 X 100MG PO TBPK
3.0000 | ORAL_TABLET | Freq: Two times a day (BID) | ORAL | 0 refills | Status: AC
  Filled 2023-06-09: qty 30, 5d supply, fill #0

## 2023-06-12 ENCOUNTER — Encounter (HOSPITAL_BASED_OUTPATIENT_CLINIC_OR_DEPARTMENT_OTHER): Payer: Self-pay

## 2023-06-12 ENCOUNTER — Ambulatory Visit (HOSPITAL_BASED_OUTPATIENT_CLINIC_OR_DEPARTMENT_OTHER): Admit: 2023-06-12 | Payer: No Typology Code available for payment source | Admitting: Obstetrics and Gynecology

## 2023-06-12 SURGERY — XI ROBOTIC ASSISTED LAPAROSCOPIC HYSTERECTOMY AND SALPINGECTOMY
Anesthesia: Choice | Laterality: Bilateral

## 2023-06-15 ENCOUNTER — Other Ambulatory Visit (HOSPITAL_COMMUNITY): Payer: Self-pay

## 2023-06-15 MED ORDER — AZITHROMYCIN 250 MG PO TABS
ORAL_TABLET | ORAL | 0 refills | Status: AC
  Filled 2023-06-15: qty 6, 5d supply, fill #0

## 2023-06-27 ENCOUNTER — Other Ambulatory Visit (HOSPITAL_COMMUNITY): Payer: Self-pay

## 2023-06-28 ENCOUNTER — Other Ambulatory Visit (HOSPITAL_COMMUNITY): Payer: Self-pay

## 2023-06-28 MED ORDER — DAPAGLIFLOZIN PROPANEDIOL 5 MG PO TABS
5.0000 mg | ORAL_TABLET | Freq: Every day | ORAL | 0 refills | Status: DC
  Filled 2023-06-28: qty 30, 30d supply, fill #0

## 2023-07-11 ENCOUNTER — Other Ambulatory Visit: Payer: Self-pay

## 2023-07-11 ENCOUNTER — Other Ambulatory Visit (HOSPITAL_COMMUNITY): Payer: Self-pay

## 2023-07-19 ENCOUNTER — Other Ambulatory Visit (HOSPITAL_COMMUNITY): Payer: Self-pay

## 2023-07-19 MED ORDER — VALACYCLOVIR HCL 1 G PO TABS
1000.0000 mg | ORAL_TABLET | Freq: Three times a day (TID) | ORAL | 0 refills | Status: AC
  Filled 2023-07-19: qty 21, 7d supply, fill #0

## 2023-07-20 ENCOUNTER — Inpatient Hospital Stay: Payer: No Typology Code available for payment source | Attending: Adult Health | Admitting: Adult Health

## 2023-07-20 ENCOUNTER — Other Ambulatory Visit (HOSPITAL_COMMUNITY): Payer: Self-pay

## 2023-07-21 ENCOUNTER — Other Ambulatory Visit (HOSPITAL_COMMUNITY): Payer: Self-pay

## 2023-07-30 ENCOUNTER — Other Ambulatory Visit (HOSPITAL_COMMUNITY): Payer: Self-pay

## 2023-08-02 ENCOUNTER — Other Ambulatory Visit: Payer: Self-pay

## 2023-08-02 ENCOUNTER — Other Ambulatory Visit (HOSPITAL_COMMUNITY): Payer: Self-pay

## 2023-08-02 MED ORDER — DAPAGLIFLOZIN PROPANEDIOL 5 MG PO TABS
5.0000 mg | ORAL_TABLET | Freq: Every day | ORAL | 5 refills | Status: AC
  Filled 2023-08-02: qty 30, 30d supply, fill #0
  Filled 2023-10-17: qty 30, 30d supply, fill #1

## 2023-08-02 MED ORDER — AMLODIPINE BESYLATE 5 MG PO TABS
5.0000 mg | ORAL_TABLET | Freq: Every day | ORAL | 1 refills | Status: AC
  Filled 2023-08-02: qty 90, 90d supply, fill #0

## 2023-08-02 MED ORDER — MOUNJARO 12.5 MG/0.5ML ~~LOC~~ SOAJ
12.5000 mg | SUBCUTANEOUS | 0 refills | Status: AC
  Filled 2023-08-02: qty 2, 28d supply, fill #0

## 2023-08-02 MED ORDER — FREESTYLE LIBRE 3 PLUS SENSOR MISC
11 refills | Status: AC
  Filled 2023-08-02: qty 2, 30d supply, fill #0

## 2023-08-03 ENCOUNTER — Other Ambulatory Visit (HOSPITAL_COMMUNITY): Payer: Self-pay

## 2023-08-13 ENCOUNTER — Other Ambulatory Visit (HOSPITAL_COMMUNITY): Payer: Self-pay

## 2023-08-17 ENCOUNTER — Other Ambulatory Visit (HOSPITAL_COMMUNITY): Payer: Self-pay

## 2023-08-27 ENCOUNTER — Other Ambulatory Visit (HOSPITAL_COMMUNITY): Payer: Self-pay

## 2023-08-27 IMAGING — MR MR BREAST BILAT WO/W CM
4 of 10 series · 19 of 48 positions shown · IV contrast (gadavist)
Comparison: Previous exam(s).

CLINICAL DATA: High risk screening. Elevated life time risk of
breast cancer and 5 yr risk of breast cancer, which is 29% per TC
model and 1.9% 5-year risk per Gail's model.

EXAM:
BILATERAL BREAST MRI WITH AND WITHOUT CONTRAST
TECHNIQUE: Multiplanar, multisequence MR images of both breasts were obtained
prior to and following the intravenous administration of 10 ml of
Gadavist

[Series 3: ax ir · axial · 3.0mm · 0.78mm/px · z∈[-57,+126]mm · 2 of 62 slices shown]
[im 1/62]
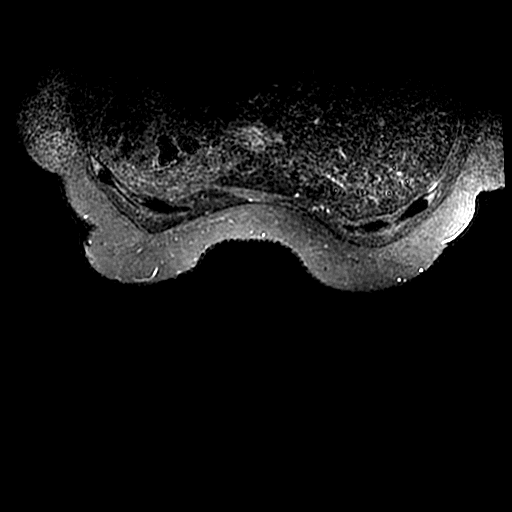
[im 62/62]
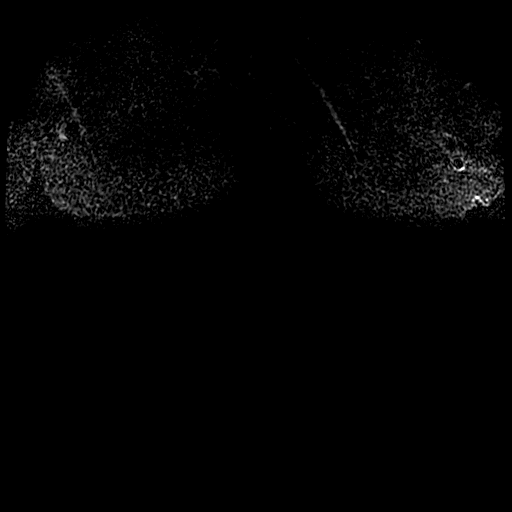

[Series 900: (id) vibrant mph · axial · 1.8mm · 0.78mm/px · z∈[-61,+126]mm · 6 of 208 slices shown]
[im 1/208]
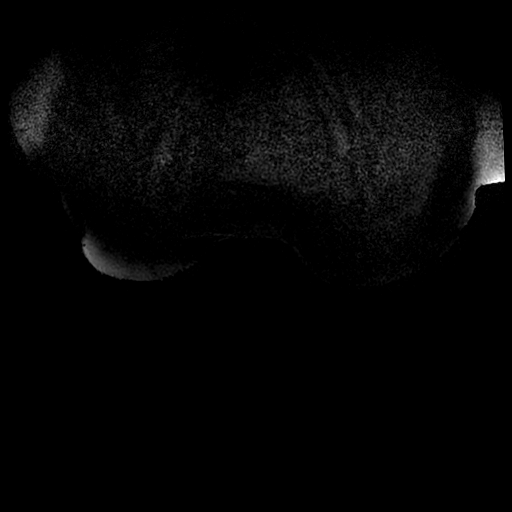
[im 42/208]
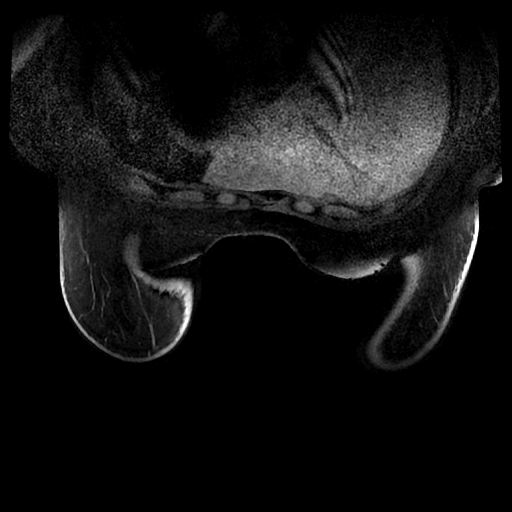
[im 83/208]
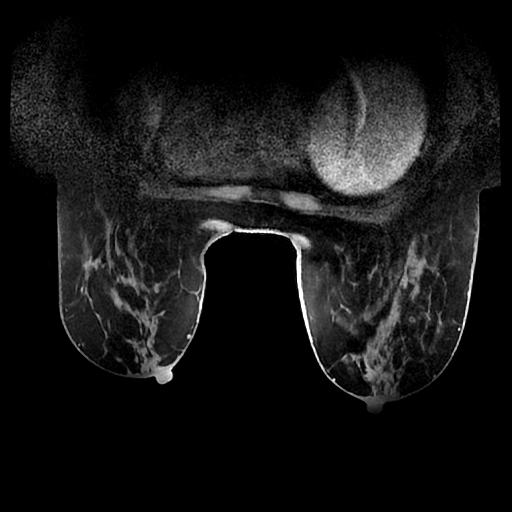
[im 125/208]
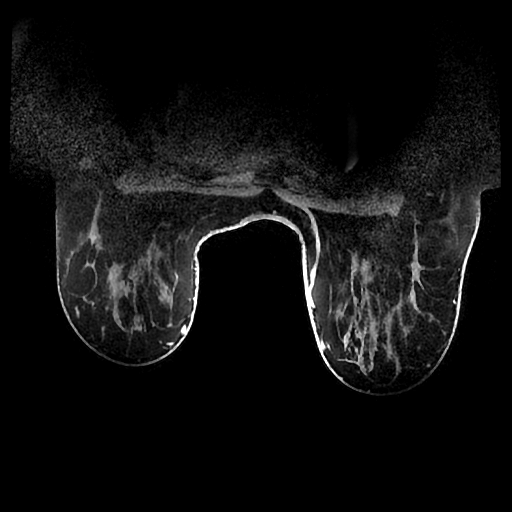
[im 166/208]
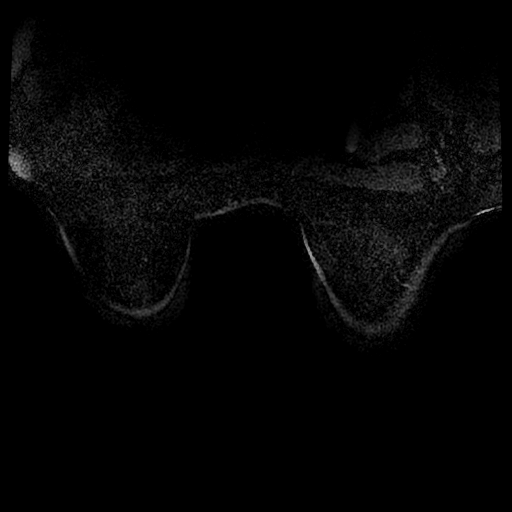
[im 208/208]
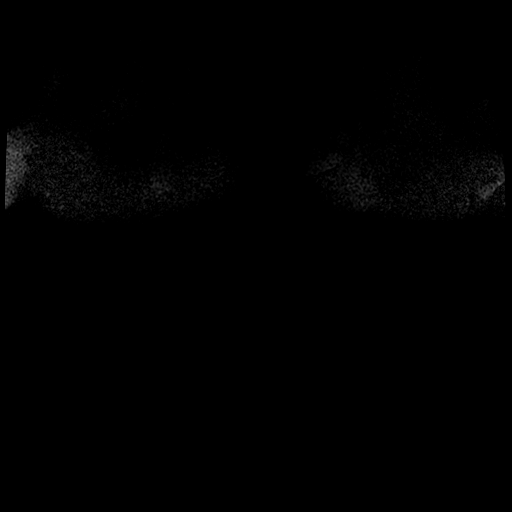

[Series 901: ph1/(id) vibrant mph · axial · 1.8mm · 0.78mm/px · z∈[-61,+126]mm · 6 of 208 slices shown]
[im 1/208]
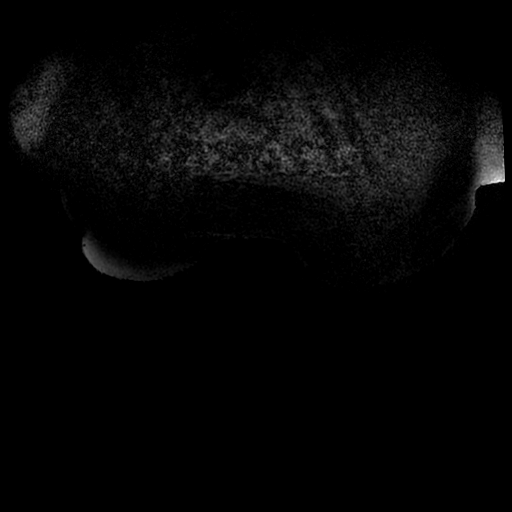
[im 42/208]
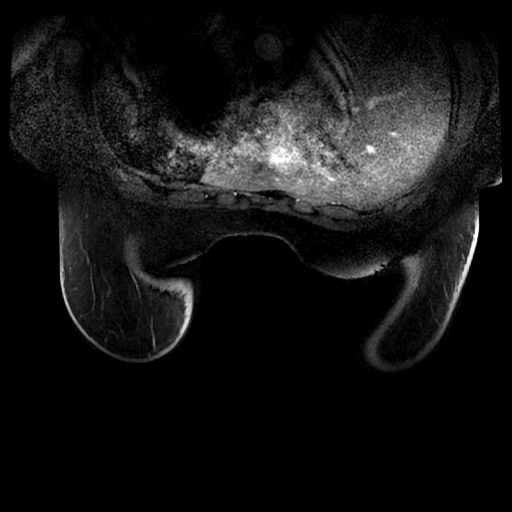
[im 83/208]
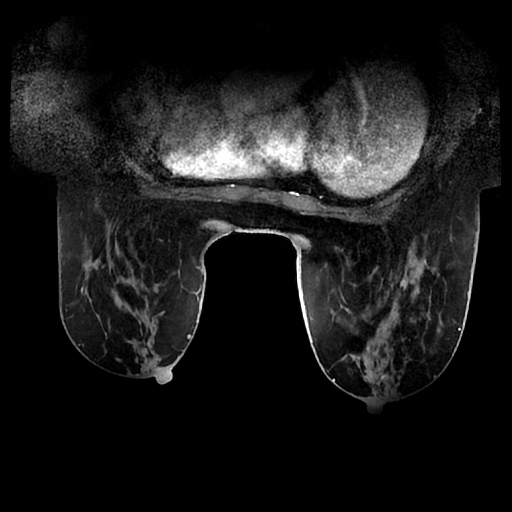
[im 125/208]
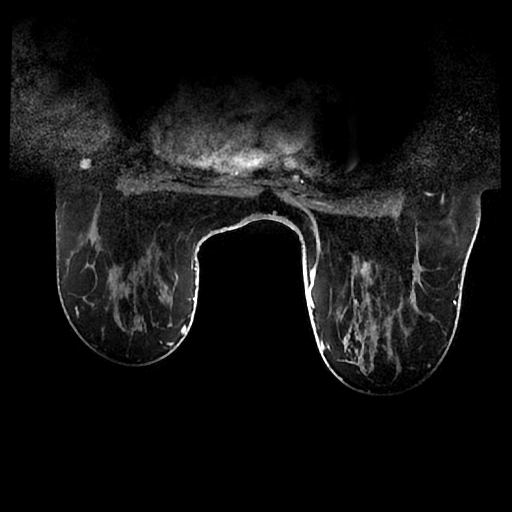
[im 166/208]
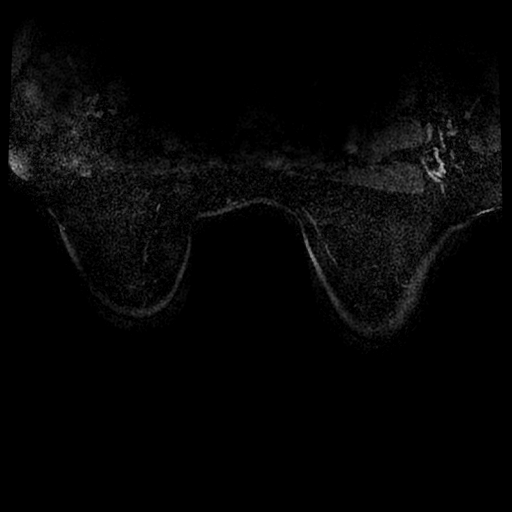
[im 208/208]
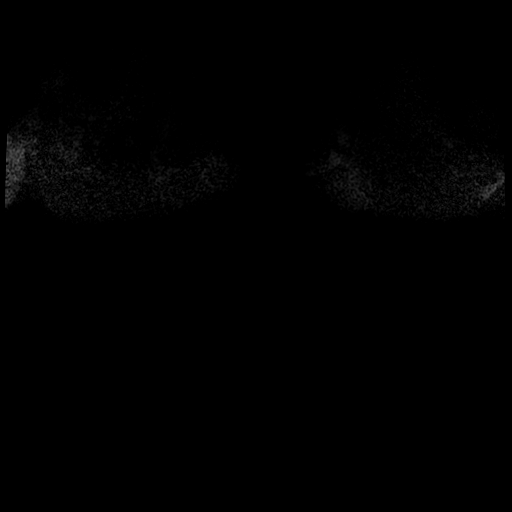

[Series 902: ph2/(id) vibrant mph · axial · 1.8mm · 0.78mm/px · z∈[-61,+126]mm · 5 of 208 slices shown]
[im 1/208]
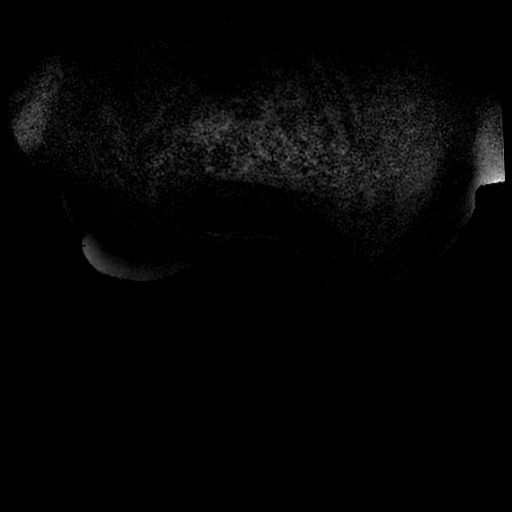
[im 42/208]
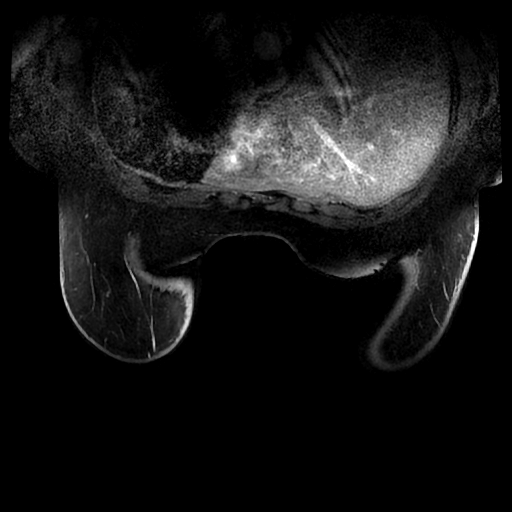
[im 83/208]
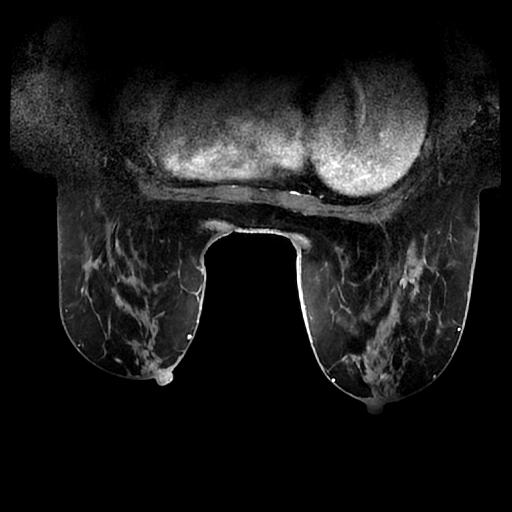
[im 125/208]
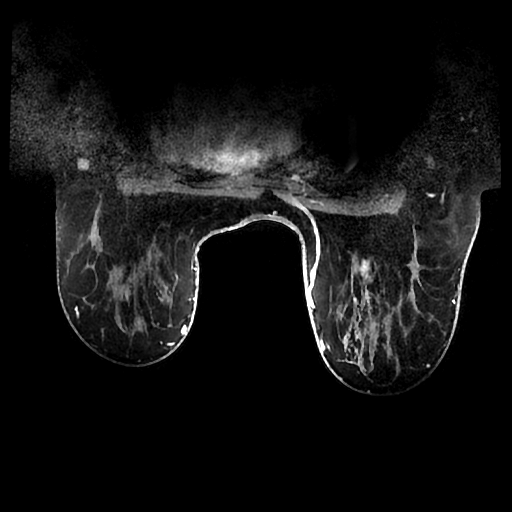
[im 208/208]
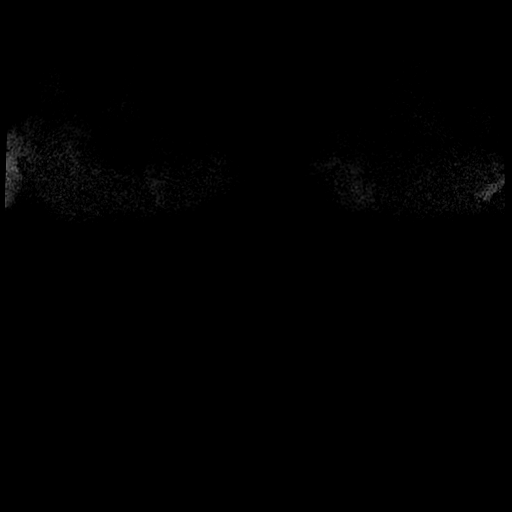

[19 of 48 positions shown; findings below may reference images not displayed]

Three-dimensional MR images were rendered by post-processing of the
original MR data on an independent workstation. The
three-dimensional MR images were interpreted, and findings are
reported in the following complete MRI report for this study. Three
dimensional images were evaluated at the independent interpreting
workstation using the DynaCAD thin client.
FINDINGS: Breast composition: b. Scattered fibroglandular tissue.

Background parenchymal enhancement: Moderate.

Right breast: No mass or abnormal enhancement.

Left breast: No mass or abnormal enhancement.

Lymph nodes: No abnormal appearing lymph nodes.

Ancillary findings:  None.
IMPRESSION: No MRI evidence of breast malignancy.

RECOMMENDATION:
1. Annual screening mammography. Last screening study performed on
03/25/2021.
[DATE]. Supplemental high risk screening breast MRI in 1 year given the
patient's elevated lifetime risk for developing breast carcinoma.

BI-RADS CATEGORY  1: Negative.

## 2023-08-27 MED ORDER — MOUNJARO 15 MG/0.5ML ~~LOC~~ SOAJ
15.0000 mg | SUBCUTANEOUS | 1 refills | Status: AC
  Filled 2023-08-27: qty 6, 84d supply, fill #0
  Filled 2023-08-28: qty 2, 28d supply, fill #0
  Filled 2023-10-11: qty 2, 28d supply, fill #1
  Filled 2024-02-01: qty 2, 28d supply, fill #2

## 2023-08-28 ENCOUNTER — Other Ambulatory Visit (HOSPITAL_COMMUNITY): Payer: Self-pay

## 2023-10-11 ENCOUNTER — Other Ambulatory Visit: Payer: Self-pay

## 2023-10-12 ENCOUNTER — Other Ambulatory Visit (HOSPITAL_COMMUNITY): Payer: Self-pay

## 2023-10-24 ENCOUNTER — Other Ambulatory Visit (HOSPITAL_BASED_OUTPATIENT_CLINIC_OR_DEPARTMENT_OTHER): Payer: Self-pay

## 2023-10-24 ENCOUNTER — Other Ambulatory Visit (HOSPITAL_COMMUNITY): Payer: Self-pay

## 2023-10-24 MED ORDER — VENLAFAXINE HCL 37.5 MG PO TABS
37.5000 mg | ORAL_TABLET | Freq: Every day | ORAL | 0 refills | Status: AC
  Filled 2023-10-24: qty 30, 30d supply, fill #0

## 2023-11-16 ENCOUNTER — Other Ambulatory Visit (HOSPITAL_COMMUNITY): Payer: Self-pay

## 2023-11-19 ENCOUNTER — Other Ambulatory Visit (HOSPITAL_COMMUNITY): Payer: Self-pay

## 2023-11-19 MED ORDER — VENLAFAXINE HCL 75 MG PO TABS
75.0000 mg | ORAL_TABLET | Freq: Every day | ORAL | 0 refills | Status: DC
  Filled 2023-11-19: qty 20, 20d supply, fill #0
  Filled 2023-11-20: qty 10, 10d supply, fill #0

## 2023-11-20 ENCOUNTER — Other Ambulatory Visit (HOSPITAL_COMMUNITY): Payer: Self-pay

## 2023-12-03 ENCOUNTER — Other Ambulatory Visit (HOSPITAL_COMMUNITY): Payer: Self-pay

## 2023-12-03 ENCOUNTER — Other Ambulatory Visit: Payer: Self-pay

## 2023-12-03 MED ORDER — AMLODIPINE BESYLATE 5 MG PO TABS
5.0000 mg | ORAL_TABLET | Freq: Every day | ORAL | 1 refills | Status: AC
  Filled 2023-12-03 – 2024-01-17 (×2): qty 90, 90d supply, fill #0

## 2023-12-03 MED ORDER — OMEPRAZOLE 40 MG PO CPDR
40.0000 mg | DELAYED_RELEASE_CAPSULE | Freq: Every day | ORAL | 1 refills | Status: AC
  Filled 2023-12-03: qty 90, 90d supply, fill #0

## 2023-12-03 MED ORDER — MOUNJARO 15 MG/0.5ML ~~LOC~~ SOAJ
15.0000 mg | SUBCUTANEOUS | 1 refills | Status: AC
Start: 2023-12-03 — End: ?
  Filled 2023-12-03: qty 2, 28d supply, fill #0
  Filled 2023-12-19: qty 6, 84d supply, fill #0
  Filled 2023-12-19: qty 2, 28d supply, fill #0
  Filled 2024-03-18: qty 2, 28d supply, fill #1

## 2023-12-03 MED ORDER — ATORVASTATIN CALCIUM 10 MG PO TABS
10.0000 mg | ORAL_TABLET | Freq: Every day | ORAL | 3 refills | Status: AC
  Filled 2023-12-03: qty 90, 90d supply, fill #0

## 2023-12-03 MED ORDER — VALACYCLOVIR HCL 1 G PO TABS
1000.0000 mg | ORAL_TABLET | Freq: Three times a day (TID) | ORAL | 0 refills | Status: AC | PRN
  Filled 2023-12-03: qty 21, 7d supply, fill #0

## 2023-12-03 MED ORDER — DAPAGLIFLOZIN PROPANEDIOL 5 MG PO TABS
5.0000 mg | ORAL_TABLET | Freq: Every day | ORAL | 5 refills | Status: AC
  Filled 2023-12-03: qty 30, 30d supply, fill #0

## 2023-12-03 MED ORDER — FREESTYLE LIBRE 3 PLUS SENSOR MISC
11 refills | Status: AC
  Filled 2023-12-03: qty 2, 30d supply, fill #0

## 2023-12-04 ENCOUNTER — Other Ambulatory Visit (HOSPITAL_COMMUNITY): Payer: Self-pay

## 2023-12-04 ENCOUNTER — Encounter (HOSPITAL_COMMUNITY): Payer: Self-pay

## 2023-12-07 ENCOUNTER — Other Ambulatory Visit (HOSPITAL_COMMUNITY): Payer: Self-pay

## 2023-12-07 MED ORDER — DAPAGLIFLOZIN PROPANEDIOL 10 MG PO TABS
10.0000 mg | ORAL_TABLET | Freq: Every day | ORAL | 5 refills | Status: AC
  Filled 2023-12-07: qty 30, 30d supply, fill #0
  Filled 2024-01-28: qty 30, 30d supply, fill #1
  Filled 2024-03-06: qty 30, 30d supply, fill #2

## 2023-12-12 ENCOUNTER — Other Ambulatory Visit (HOSPITAL_COMMUNITY): Payer: Self-pay

## 2023-12-14 ENCOUNTER — Other Ambulatory Visit (HOSPITAL_COMMUNITY): Payer: Self-pay

## 2023-12-19 ENCOUNTER — Other Ambulatory Visit (HOSPITAL_COMMUNITY): Payer: Self-pay

## 2023-12-19 MED ORDER — AMOXICILLIN-POT CLAVULANATE 875-125 MG PO TABS
1.0000 | ORAL_TABLET | Freq: Two times a day (BID) | ORAL | 0 refills | Status: AC
  Filled 2023-12-19: qty 20, 10d supply, fill #0

## 2023-12-27 ENCOUNTER — Other Ambulatory Visit (HOSPITAL_COMMUNITY): Payer: Self-pay

## 2024-01-14 ENCOUNTER — Other Ambulatory Visit (HOSPITAL_COMMUNITY): Payer: Self-pay

## 2024-01-14 MED ORDER — VENLAFAXINE HCL 75 MG PO TABS
75.0000 mg | ORAL_TABLET | Freq: Every day | ORAL | 3 refills | Status: AC
  Filled 2024-01-14: qty 90, 90d supply, fill #0

## 2024-01-17 ENCOUNTER — Other Ambulatory Visit (HOSPITAL_COMMUNITY): Payer: Self-pay

## 2024-02-01 ENCOUNTER — Other Ambulatory Visit (HOSPITAL_COMMUNITY): Payer: Self-pay

## 2024-02-05 ENCOUNTER — Other Ambulatory Visit (HOSPITAL_COMMUNITY): Payer: Self-pay

## 2024-02-05 MED ORDER — VENLAFAXINE HCL ER 150 MG PO CP24
150.0000 mg | ORAL_CAPSULE | Freq: Every day | ORAL | 4 refills | Status: AC
  Filled 2024-02-05 – 2024-09-16 (×2): qty 90, 90d supply, fill #0

## 2024-02-15 ENCOUNTER — Other Ambulatory Visit (HOSPITAL_COMMUNITY): Payer: Self-pay

## 2024-03-06 ENCOUNTER — Other Ambulatory Visit (HOSPITAL_COMMUNITY): Payer: Self-pay

## 2024-03-06 ENCOUNTER — Other Ambulatory Visit: Payer: Self-pay

## 2024-03-07 ENCOUNTER — Other Ambulatory Visit (HOSPITAL_COMMUNITY): Payer: Self-pay

## 2024-03-07 MED ORDER — LEVOTHYROXINE SODIUM 25 MCG PO TABS
25.0000 ug | ORAL_TABLET | Freq: Every morning | ORAL | 3 refills | Status: AC
  Filled 2024-03-07: qty 90, 90d supply, fill #0
  Filled 2024-06-19: qty 90, 90d supply, fill #1
  Filled 2024-09-16 – 2024-10-09 (×2): qty 90, 90d supply, fill #2

## 2024-03-18 ENCOUNTER — Other Ambulatory Visit: Payer: Self-pay

## 2024-03-31 ENCOUNTER — Other Ambulatory Visit (HOSPITAL_COMMUNITY): Payer: Self-pay

## 2024-04-01 ENCOUNTER — Other Ambulatory Visit (HOSPITAL_COMMUNITY): Payer: Self-pay

## 2024-04-22 ENCOUNTER — Other Ambulatory Visit: Payer: Self-pay | Admitting: Radiology

## 2024-04-23 LAB — SURGICAL PATHOLOGY

## 2024-05-21 ENCOUNTER — Other Ambulatory Visit (HOSPITAL_BASED_OUTPATIENT_CLINIC_OR_DEPARTMENT_OTHER): Payer: Self-pay | Admitting: Family Medicine

## 2024-05-21 ENCOUNTER — Other Ambulatory Visit (HOSPITAL_COMMUNITY): Payer: Self-pay

## 2024-05-21 DIAGNOSIS — R0789 Other chest pain: Secondary | ICD-10-CM

## 2024-05-21 MED ORDER — AMLODIPINE BESYLATE 5 MG PO TABS
5.0000 mg | ORAL_TABLET | Freq: Every day | ORAL | 1 refills | Status: AC
  Filled 2024-05-21: qty 90, 90d supply, fill #0

## 2024-05-21 MED ORDER — BASAGLAR KWIKPEN 100 UNIT/ML ~~LOC~~ SOPN
10.0000 [IU] | PEN_INJECTOR | Freq: Every day | SUBCUTANEOUS | 3 refills | Status: AC
  Filled 2024-05-21: qty 9, 84d supply, fill #0
  Filled 2024-07-30: qty 9, 84d supply, fill #1
  Filled 2024-10-09: qty 9, 84d supply, fill #2

## 2024-05-21 MED ORDER — MOUNJARO 2.5 MG/0.5ML ~~LOC~~ SOAJ
2.5000 mg | SUBCUTANEOUS | 0 refills | Status: DC
  Filled 2024-05-21: qty 2, 28d supply, fill #0

## 2024-05-21 MED ORDER — OMEPRAZOLE 40 MG PO CPDR
40.0000 mg | DELAYED_RELEASE_CAPSULE | Freq: Every day | ORAL | 1 refills | Status: AC
  Filled 2024-05-21: qty 90, 90d supply, fill #0

## 2024-05-21 MED ORDER — FLUCONAZOLE 150 MG PO TABS
150.0000 mg | ORAL_TABLET | ORAL | 1 refills | Status: AC
  Filled 2024-05-21: qty 10, 30d supply, fill #0

## 2024-05-21 MED ORDER — ATORVASTATIN CALCIUM 10 MG PO TABS
10.0000 mg | ORAL_TABLET | Freq: Every day | ORAL | 3 refills | Status: AC
  Filled 2024-05-21: qty 90, 90d supply, fill #0

## 2024-05-22 ENCOUNTER — Other Ambulatory Visit (HOSPITAL_COMMUNITY): Payer: Self-pay

## 2024-05-22 MED ORDER — MOUNJARO 5 MG/0.5ML ~~LOC~~ SOAJ
5.0000 mg | SUBCUTANEOUS | 0 refills | Status: AC
  Filled 2024-05-22 – 2024-06-19 (×2): qty 2, 28d supply, fill #0

## 2024-06-13 ENCOUNTER — Other Ambulatory Visit (HOSPITAL_BASED_OUTPATIENT_CLINIC_OR_DEPARTMENT_OTHER)

## 2024-06-13 ENCOUNTER — Encounter (HOSPITAL_BASED_OUTPATIENT_CLINIC_OR_DEPARTMENT_OTHER): Payer: Self-pay

## 2024-06-19 ENCOUNTER — Other Ambulatory Visit (HOSPITAL_COMMUNITY): Payer: Self-pay

## 2024-07-29 ENCOUNTER — Other Ambulatory Visit (HOSPITAL_COMMUNITY): Payer: Self-pay

## 2024-07-29 MED ORDER — NAPROXEN 500 MG PO TABS
500.0000 mg | ORAL_TABLET | Freq: Two times a day (BID) | ORAL | 0 refills | Status: AC
  Filled 2024-07-29: qty 10, 5d supply, fill #0

## 2024-07-30 ENCOUNTER — Other Ambulatory Visit (HOSPITAL_COMMUNITY): Payer: Self-pay

## 2024-07-30 MED ORDER — MOUNJARO 7.5 MG/0.5ML ~~LOC~~ SOAJ
SUBCUTANEOUS | 0 refills | Status: AC
  Filled 2024-07-30: qty 2, 28d supply, fill #0

## 2024-08-20 ENCOUNTER — Other Ambulatory Visit (HOSPITAL_COMMUNITY): Payer: Self-pay

## 2024-08-20 MED ORDER — MOUNJARO 10 MG/0.5ML ~~LOC~~ SOAJ
10.0000 mg | SUBCUTANEOUS | 0 refills | Status: AC
  Filled 2024-08-20 – 2024-09-11 (×2): qty 2, 28d supply, fill #0

## 2024-08-20 MED ORDER — AMLODIPINE BESYLATE 5 MG PO TABS
5.0000 mg | ORAL_TABLET | Freq: Every day | ORAL | 1 refills | Status: AC
  Filled 2024-08-20: qty 90, 90d supply, fill #0

## 2024-08-20 MED ORDER — DAPAGLIFLOZIN PROPANEDIOL 10 MG PO TABS
10.0000 mg | ORAL_TABLET | Freq: Every day | ORAL | 5 refills | Status: AC
  Filled 2024-08-20 – 2024-10-09 (×3): qty 30, 30d supply, fill #0

## 2024-08-29 ENCOUNTER — Other Ambulatory Visit (HOSPITAL_COMMUNITY): Payer: Self-pay

## 2024-09-11 ENCOUNTER — Other Ambulatory Visit (HOSPITAL_COMMUNITY): Payer: Self-pay

## 2024-09-16 ENCOUNTER — Other Ambulatory Visit (HOSPITAL_COMMUNITY): Payer: Self-pay

## 2024-09-17 ENCOUNTER — Other Ambulatory Visit: Payer: Self-pay

## 2024-09-17 ENCOUNTER — Other Ambulatory Visit (HOSPITAL_COMMUNITY): Payer: Self-pay

## 2024-09-29 ENCOUNTER — Other Ambulatory Visit (HOSPITAL_COMMUNITY): Payer: Self-pay

## 2024-10-09 ENCOUNTER — Other Ambulatory Visit (HOSPITAL_COMMUNITY): Payer: Self-pay

## 2024-10-09 ENCOUNTER — Other Ambulatory Visit: Payer: Self-pay

## 2024-10-09 MED ORDER — BASAGLAR KWIKPEN 100 UNIT/ML ~~LOC~~ SOPN
20.0000 [IU] | PEN_INJECTOR | Freq: Every day | SUBCUTANEOUS | 1 refills | Status: AC
  Filled 2024-10-09: qty 6, 30d supply, fill #0
  Filled 2024-10-09: qty 30, 150d supply, fill #0

## 2024-10-09 MED ORDER — MOUNJARO 12.5 MG/0.5ML ~~LOC~~ SOAJ
12.5000 mg | SUBCUTANEOUS | 0 refills | Status: AC
  Filled 2024-10-09 – 2024-10-21 (×2): qty 2, 28d supply, fill #0

## 2024-10-10 ENCOUNTER — Other Ambulatory Visit: Payer: Self-pay

## 2024-10-13 ENCOUNTER — Other Ambulatory Visit (HOSPITAL_COMMUNITY): Payer: Self-pay

## 2024-10-21 ENCOUNTER — Other Ambulatory Visit: Payer: Self-pay

## 2024-10-21 ENCOUNTER — Other Ambulatory Visit (HOSPITAL_COMMUNITY): Payer: Self-pay

## 2024-10-22 ENCOUNTER — Other Ambulatory Visit (HOSPITAL_COMMUNITY): Payer: Self-pay

## 2024-10-22 MED ORDER — VENLAFAXINE HCL ER 150 MG PO CP24
150.0000 mg | ORAL_CAPSULE | Freq: Every day | ORAL | 0 refills | Status: AC
  Filled 2024-10-22: qty 90, 90d supply, fill #0

## 2024-10-27 ENCOUNTER — Other Ambulatory Visit (HOSPITAL_COMMUNITY): Payer: Self-pay
